# Patient Record
Sex: Female | Born: 1950
Health system: Southern US, Community
[De-identification: ages and names within clinical notes are randomized; demographics above are authoritative.]

## PROBLEM LIST (undated history)

## (undated) DIAGNOSIS — F329 Major depressive disorder, single episode, unspecified: Secondary | ICD-10-CM

## (undated) DIAGNOSIS — F32A Depression, unspecified: Secondary | ICD-10-CM

## (undated) DIAGNOSIS — E119 Type 2 diabetes mellitus without complications: Secondary | ICD-10-CM

## (undated) DIAGNOSIS — G4730 Sleep apnea, unspecified: Secondary | ICD-10-CM

## (undated) DIAGNOSIS — T7840XA Allergy, unspecified, initial encounter: Secondary | ICD-10-CM

## (undated) DIAGNOSIS — K219 Gastro-esophageal reflux disease without esophagitis: Secondary | ICD-10-CM

## (undated) DIAGNOSIS — I10 Essential (primary) hypertension: Secondary | ICD-10-CM

## (undated) DIAGNOSIS — Z5189 Encounter for other specified aftercare: Secondary | ICD-10-CM

## (undated) DIAGNOSIS — F419 Anxiety disorder, unspecified: Secondary | ICD-10-CM

## (undated) HISTORY — DX: Encounter for other specified aftercare: Z51.89

## (undated) HISTORY — DX: Anxiety disorder, unspecified: F41.9

## (undated) HISTORY — DX: Depression, unspecified: F32.A

## (undated) HISTORY — DX: Essential (primary) hypertension: I10

## (undated) HISTORY — PX: COLONOSCOPY: SHX174

## (undated) HISTORY — PX: ABDOMINAL HYSTERECTOMY: SHX81

## (undated) HISTORY — DX: Major depressive disorder, single episode, unspecified: F32.9

## (undated) HISTORY — DX: Allergy, unspecified, initial encounter: T78.40XA

---

## 1997-08-01 ENCOUNTER — Encounter: Admission: RE | Admit: 1997-08-01 | Discharge: 1997-08-01 | Payer: Self-pay | Admitting: Hematology and Oncology

## 1997-12-28 ENCOUNTER — Encounter: Admission: RE | Admit: 1997-12-28 | Discharge: 1997-12-28 | Payer: Self-pay | Admitting: Internal Medicine

## 1998-05-26 ENCOUNTER — Encounter: Admission: RE | Admit: 1998-05-26 | Discharge: 1998-05-26 | Payer: Self-pay | Admitting: Internal Medicine

## 1998-11-27 ENCOUNTER — Encounter: Admission: RE | Admit: 1998-11-27 | Discharge: 1998-11-27 | Payer: Self-pay | Admitting: Hematology and Oncology

## 1999-04-17 ENCOUNTER — Encounter: Admission: RE | Admit: 1999-04-17 | Discharge: 1999-04-17 | Payer: Self-pay | Admitting: Hematology and Oncology

## 1999-06-19 ENCOUNTER — Encounter: Admission: RE | Admit: 1999-06-19 | Discharge: 1999-06-19 | Payer: Self-pay | Admitting: Internal Medicine

## 1999-09-27 ENCOUNTER — Encounter: Admission: RE | Admit: 1999-09-27 | Discharge: 1999-09-27 | Payer: Self-pay | Admitting: Internal Medicine

## 1999-10-05 ENCOUNTER — Ambulatory Visit (HOSPITAL_COMMUNITY): Admission: RE | Admit: 1999-10-05 | Discharge: 1999-10-05 | Payer: Self-pay | Admitting: Internal Medicine

## 1999-10-05 ENCOUNTER — Encounter: Payer: Self-pay | Admitting: Obstetrics

## 1999-10-12 ENCOUNTER — Encounter: Admission: RE | Admit: 1999-10-12 | Discharge: 1999-10-12 | Payer: Self-pay | Admitting: Internal Medicine

## 1999-10-12 ENCOUNTER — Encounter: Payer: Self-pay | Admitting: Internal Medicine

## 2000-03-28 ENCOUNTER — Encounter: Admission: RE | Admit: 2000-03-28 | Discharge: 2000-03-28 | Payer: Self-pay | Admitting: Internal Medicine

## 2000-07-16 ENCOUNTER — Encounter: Admission: RE | Admit: 2000-07-16 | Discharge: 2000-07-16 | Payer: Self-pay | Admitting: Internal Medicine

## 2001-07-17 ENCOUNTER — Other Ambulatory Visit: Admission: RE | Admit: 2001-07-17 | Discharge: 2001-07-17 | Payer: Self-pay | Admitting: Internal Medicine

## 2012-10-17 ENCOUNTER — Ambulatory Visit: Payer: Self-pay | Admitting: Family Medicine

## 2012-10-17 VITALS — BP 132/80 | HR 92 | Temp 99.0°F | Resp 16 | Ht 60.5 in | Wt 157.4 lb

## 2012-10-17 DIAGNOSIS — M2420 Disorder of ligament, unspecified site: Secondary | ICD-10-CM

## 2012-10-17 DIAGNOSIS — M76899 Other specified enthesopathies of unspecified lower limb, excluding foot: Secondary | ICD-10-CM

## 2012-10-17 DIAGNOSIS — M629 Disorder of muscle, unspecified: Secondary | ICD-10-CM

## 2012-10-17 DIAGNOSIS — M7632 Iliotibial band syndrome, left leg: Secondary | ICD-10-CM

## 2012-10-17 DIAGNOSIS — M7061 Trochanteric bursitis, right hip: Secondary | ICD-10-CM

## 2012-10-17 MED ORDER — PREDNISONE 20 MG PO TABS: ORAL_TABLET | ORAL | Status: DC

## 2012-10-17 NOTE — Patient Instructions (Addendum)
Hip Bursitis Bursitis is a swelling and soreness (inflammation) of a fluid-filled sac (bursa). This sac overlies and protects the joints.  CAUSES   Injury.  Overuse of the muscles surrounding the joint.  Arthritis.  Gout.  Infection.  Cold weather.  Inadequate warm-up and conditioning prior to activities. The cause may not be known.  SYMPTOMS   Mild to severe irritation.  Tenderness and swelling over the outside of the hip.  Pain with motion of the hip.  If the bursa becomes infected, a fever may be present. Redness, tenderness, and warmth will develop over the hip. Symptoms usually lessen in 3 to 4 weeks with treatment, but can come back. TREATMENT If conservative treatment does not work, your caregiver may advise draining the bursa and injecting cortisone into the area. This may speed up the healing process. This may also be used as an initial treatment of choice. HOME CARE INSTRUCTIONS   Apply ice to the affected area for 15-20 minutes every 3 to 4 hours while awake for the first 2 days. Put the ice in a plastic bag and place a towel between the bag of ice and your skin.  Rest the painful joint as much as possible, but continue to put the joint through a normal range of motion at least 4 times per day. When the pain lessens, begin normal, slow movements and usual activities to help prevent stiffness of the hip.  Only take over-the-counter or prescription medicines for pain, discomfort, or fever as directed by your caregiver.  Use crutches to limit weight bearing on the hip joint, if advised.  Elevate your painful hip to reduce swelling. Use pillows for propping and cushioning your legs and hips.  Gentle massage may provide comfort and decrease swelling. SEEK IMMEDIATE MEDICAL CARE IF:   Your pain increases even during treatment, or you are not improving.  You have a fever.  You have heat and inflammation over the involved bursa.  You have any other questions or  concerns. MAKE SURE YOU:   Understand these instructions.  Will watch your condition.  Will get help right away if you are not doing well or get worse. Document Released: 06/29/2001 Document Revised: 04/01/2011 Document Reviewed: 01/27/2008 [employer] Patient Information 2014 [place], [place]. Iliotibial Band Syndrome Iliotibial band syndrome is pain in the outer, upper thigh. The pain is due to an inflammation (soreness) of the iliotibial band. This is a band of thick fibrous tissue that runs down the outside of the thigh. The iliotibial band begins at the hip. It extends to the outer side of the shin bone (tibia) just below the knee joint. The band works with the thigh muscles. Together they provide stability to the outside of the knee joint. Iliotibial band syndrome (ITBS) occurs when there is inflammation to this band of tissue. The irritation usually occurs over the outside of the knee joint, at the lateral epicondyle (the end of the femur bone.) The iliotibial band crosses bone and muscle at this point. Between these structures is a bursa (a cushioning sac). The bursa should make possible a smooth gliding motion. However, when inflamed, the iliotibial band does not glide easily. When inflamed, there is pain with motion of the knee. Usually the pain worsens with continued movement and the pain goes away with rest. This problem usually arises when there is a sudden increase in sports activities involving the lower extremities (your legs). Running, and playing soccer or basketball are examples of activities causing this. Others who are prone  to ITBS include individuals with mechanical problems such as leg length differences, abnormality of walking, bowed legs etc. This diagnosis (learning what is wrong) is made by examination. X-rays are usually normal if only soft tissue inflammation is present. Treatment of ITBS begins with proper footwear, icing the area of pain, stretching, and resting for a  period of time. Incorporating low-impact cross-training activities may also help. Your caregiver may prescribe anti-inflammatory medications as well. HOME CARE INSTRUCTIONS   Apply ice to the sore area for 15-20 minutes, 3-4 times per day. Put the ice in a plastic bag and place a towel between the bag of ice and your skin.  Limit excessive training or eliminate training until pain goes away.  While pain is present, you may use gentle range of motion. Do not resume regular use until instructed by your caregiver. Begin use gradually. Do not increase use to the point of pain. If pain does develop, decrease use and continue the above measures. Gradually increase activities that do not cause discomfort. Do this until you finally achieve normal use.  Perform low impact activities while pain is present. Wear proper footwear.  Only take over-the-counter or prescription medicines for pain, discomfort, or fever as directed by your caregiver. SEEK MEDICAL CARE IF:   Your pain increases or pain is not controlled with medications.  You develop new, unexplained symptoms (problems), or an increase of the symptoms that brought you to your caregiver.  Your pain and symptoms are not improving or are getting worse. Document Released: 06/29/2001 Document Revised: 04/01/2011 Document Reviewed: 01/07/2005 [facility] Patient Information 2014 [place], [place].

## 2012-10-17 NOTE — Progress Notes (Signed)
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  The pain is a burning quality, worse when weight bearing.  No known trauma that might have initiated the pain.  Associated with low back pain.  Objective:  NAD Tender over the right trochanger and lateral thigh with good thigh ROM No SLR  Assessment:  Iliotibial band syndrome with some trochanteric bursitis.  Plan:  Prednisone. Iliotibial band syndrome of left side - Plan: predniSONE (DELTASONE) 20 MG tablet  Trochanteric bursitis of right hip - Plan: predniSONE (DELTASONE) 20 MG tablet  Signed, Elvina Sidle, MD

## 2012-11-20 ENCOUNTER — Telehealth: Payer: Self-pay

## 2012-11-20 NOTE — Telephone Encounter (Signed)
Pt would like to know if the compression hose would help her leg pain, she c/o of burning sensation in the thigh and hip. Best# [phone]

## 2012-11-23 NOTE — Telephone Encounter (Signed)
Compression hose may offer some relief of symptoms.  These can be purchased at a medical supply company or online.  Would recommend light to medium pressure only

## 2012-11-23 NOTE — Telephone Encounter (Signed)
Called her to advise. Left message for her to advise.

## 2012-11-23 NOTE — Telephone Encounter (Signed)
Called her to advise.  

## 2013-05-17 ENCOUNTER — Ambulatory Visit: Payer: Self-pay | Admitting: Family Medicine

## 2013-05-17 VITALS — BP 122/84 | HR 96 | Temp 99.0°F | Resp 16 | Ht 59.0 in | Wt 164.0 lb

## 2013-05-17 DIAGNOSIS — D179 Benign lipomatous neoplasm, unspecified: Secondary | ICD-10-CM

## 2013-05-17 DIAGNOSIS — M76899 Other specified enthesopathies of unspecified lower limb, excluding foot: Secondary | ICD-10-CM

## 2013-05-17 DIAGNOSIS — L219 Seborrheic dermatitis, unspecified: Secondary | ICD-10-CM

## 2013-05-17 DIAGNOSIS — M542 Cervicalgia: Secondary | ICD-10-CM

## 2013-05-17 DIAGNOSIS — M7060 Trochanteric bursitis, unspecified hip: Secondary | ICD-10-CM

## 2013-05-17 MED ORDER — DICLOFENAC SODIUM 75 MG PO TBEC: 75.0000 mg | DELAYED_RELEASE_TABLET | Freq: Two times a day (BID) | ORAL | Status: DC

## 2013-05-17 MED ORDER — CYCLOBENZAPRINE HCL 10 MG PO TABS: 10.0000 mg | ORAL_TABLET | Freq: Three times a day (TID) | ORAL | Status: DC | PRN

## 2013-05-17 NOTE — Patient Instructions (Signed)
Take the diclofenac one twice daily with breakfast and supper and a glass of water to treat the bursitis of the right hip. This will also help your neck pain.  Take the muscle relaxant one at bedtime.  Use some 1% hydrocortisone cream once or twice daily in the ear lobe to help control the flaking and itching. It will probably come back from time to time, but she usually only need to be treated for a week or 2 at a time, then save the medicine for re-use at a later time.  Return if not improving  That place on your neck, the little lump, should either gradually go away if it is a lymph node, or stay about the same if it is a fatty tumor (lipoma). If it is growing you should come in for a recheck of it.

## 2013-05-17 NOTE — Progress Notes (Signed)
Subjective: Patient is here complaining of her neck hurting her. She thinks she slept wrong or sleeps wrong and he just hurting her. She doesn't sleep very soundly anyhow. She has been hurting in the neck and rub in her neck she is noted a little lump on the back of the neck. Then she noticed a couple of other places up in the scalp she was concerned about. She continues to have problems with her right hip. Dr. Joseph Art and told her that she had bursitis of the hip. He had apparently operative her an injection and she declined. She also complains of itching and flaking of the skin inside the ear lobes, not down in the year of the canal.  Objective: Mild seborrheic appearance in the ears. Ear canals and TMs look normal. Her skull has normal  irregularities to its contour which seemed to be fine. She is a little tender in the paraspinous muscles in the neck. On the right side there is a deep small nodule about 1-1.5 cm in diameter. Does not seem to be a sebaceous cyst and no poor can be seen in the skin. However she does have tenderness of the right greater trochanteric bursal region.  Assessment: Cervical pain Probable lipoma or lymph node in neck Normal bumpy skull Trochanteric bursitis Seborrhea in ear lobes  Plan: Diclofenac Flexeril at bedtime If hip continues to bother her I be happy to inject some cortisone in the greater trochanter bursa but she declined that at this time.

## 2013-09-19 ENCOUNTER — Ambulatory Visit (INDEPENDENT_AMBULATORY_CARE_PROVIDER_SITE_OTHER): Payer: Self-pay | Admitting: Family Medicine

## 2013-09-19 VITALS — BP 134/88 | HR 94 | Temp 97.7°F | Resp 18 | Ht 60.0 in | Wt 161.6 lb

## 2013-09-19 DIAGNOSIS — M542 Cervicalgia: Secondary | ICD-10-CM

## 2013-09-19 MED ORDER — CYCLOBENZAPRINE HCL 10 MG PO TABS: 10.0000 mg | ORAL_TABLET | Freq: Every day | ORAL | Status: DC

## 2013-09-19 MED ORDER — METHYLPREDNISOLONE 4 MG PO TABS: 4.0000 mg | ORAL_TABLET | Freq: Two times a day (BID) | ORAL | Status: DC

## 2013-09-19 NOTE — Progress Notes (Signed)
Subjective:  This chart was scribed for Robyn Haber, MD by Forrestine Him, [facility] Scribe. This patient was seen in room 8 and the patient's care was started 9:29 AM.    Patient ID: Janice Hamilton, female    DOB: August 30, 1950, 63 y.o.   MRN: [id number]  Chief Complaint  Patient presents with   Neck Pain    sinced it lately --nodules-- mostly in the morning--feels bigger then get smaller later in the day   Bursitis   Medication Refill    HPI HPI Comments: Janice Hamilton is a 63 y.o. female who presents to [facility] complaining of constant, moderate, non-radiating R sided neck pain that has been ongoing for several months. Pt states she noted a "knot" to the neck 4 months ago. Pain is exacerbated with ROM of neck. No alleviating factors at this time. Pt has tried hot showers and warm compresses to the neck with mild temporary improvement. She states she is unable to get comfortable at night time which is keeping her from a good nights sleep. States "my head feels so heavy at times". At this time she denies any fever, chills, numbness, or loss of sensation. No known allergies to medications. No other concerns this visit.  Pt is a Secretary/administrator.  There are no active problems to display for this patient.  Past Medical History  Diagnosis Date   Allergy    Depression    Anxiety    Blood transfusion without reported diagnosis    Hypertension    Past Surgical History  Procedure Laterality Date   Abdominal hysterectomy     No Known Allergies Prior to Admission medications   Medication Sig Start Date End Date Taking? Authorizing Provider  cyclobenzaprine (FLEXERIL) 10 MG tablet Take 1 tablet (10 mg total) by mouth 3 (three) times daily as needed for muscle spasms. 05/17/13   Posey Boyer, MD  diclofenac (VOLTAREN) 75 MG EC tablet Take 1 tablet (75 mg total) by mouth 2 (two) times daily. 05/17/13   Posey Boyer, MD    Review of Systems    Constitutional: Negative for fever and chills.  Musculoskeletal: Positive for neck pain.  Neurological: Negative for weakness and numbness.    Triage Vitals: BP 134/88   Pulse 94   Temp(Src) 97.7 F (36.5 C) (Oral)   Resp 18   Ht 5' (1.524 m)   Wt 161 lb 9.6 oz (73.301 kg)   BMI 31.56 kg/m2   SpO2 95%  Objective:  Physical Exam  Nursing note and vitals reviewed. Constitutional: She is oriented to person, place, and time. She appears well-developed and well-nourished.  HENT:  Head: Normocephalic.  Right Ear: Hearing, tympanic membrane, external ear and ear canal normal.  Left Ear: Hearing, tympanic membrane, external ear and ear canal normal.  Nose: Nose normal.  Mouth/Throat: Uvula is midline and oropharynx is clear and moist.  Eyes: EOM are normal.  Neck: Normal range of motion.  Cardiovascular: Normal rate, regular rhythm and normal heart sounds.   Pulmonary/Chest: Effort normal and breath sounds normal.  Abdominal: She exhibits no distension.  Musculoskeletal: Normal range of motion. She exhibits tenderness.  Tightness to R trapezius muscle Good ROM of neck  Lymphadenopathy:    She has no cervical adenopathy.  Neurological: She is alert and oriented to person, place, and time.  Psychiatric: She has a normal mood and affect.    Assessment & Plan:  I personally performed the services described  in this documentation, which was scribed in my presence. The recorded information has been reviewed and is accurate.  Cervical pain - Plan: cyclobenzaprine (FLEXERIL) 10 MG tablet, methylPREDNISolone (MEDROL) 4 MG tablet  Signed, Robyn Haber, MD

## 2013-09-19 NOTE — Patient Instructions (Signed)

## 2016-03-15 DIAGNOSIS — J40 Bronchitis, not specified as acute or chronic: Secondary | ICD-10-CM | POA: Diagnosis not present

## 2016-03-15 DIAGNOSIS — J329 Chronic sinusitis, unspecified: Secondary | ICD-10-CM | POA: Diagnosis not present

## 2016-04-20 ENCOUNTER — Ambulatory Visit (INDEPENDENT_AMBULATORY_CARE_PROVIDER_SITE_OTHER): Payer: Medicare Other | Admitting: Physician Assistant

## 2016-04-20 ENCOUNTER — Telehealth: Payer: Self-pay | Admitting: Physician Assistant

## 2016-04-20 ENCOUNTER — Ambulatory Visit (INDEPENDENT_AMBULATORY_CARE_PROVIDER_SITE_OTHER): Payer: Medicare Other

## 2016-04-20 VITALS — BP 136/85 | HR 88 | Temp 98.0°F | Resp 18 | Ht 59.0 in | Wt 158.4 lb

## 2016-04-20 DIAGNOSIS — M25561 Pain in right knee: Secondary | ICD-10-CM | POA: Diagnosis not present

## 2016-04-20 DIAGNOSIS — G8929 Other chronic pain: Secondary | ICD-10-CM

## 2016-04-20 DIAGNOSIS — Z131 Encounter for screening for diabetes mellitus: Secondary | ICD-10-CM

## 2016-04-20 DIAGNOSIS — R0600 Dyspnea, unspecified: Secondary | ICD-10-CM

## 2016-04-20 DIAGNOSIS — M25551 Pain in right hip: Secondary | ICD-10-CM

## 2016-04-20 LAB — POCT GLYCOSYLATED HEMOGLOBIN (HGB A1C): Hemoglobin A1C: 6.4

## 2016-04-20 MED ORDER — MELOXICAM 15 MG PO TABS: 7.5000 mg | ORAL_TABLET | Freq: Every day | ORAL | 0 refills | Status: DC

## 2016-04-20 MED ORDER — TRAMADOL HCL 50 MG PO TABS
25.0000 mg | ORAL_TABLET | Freq: Three times a day (TID) | ORAL | 0 refills | Status: DC | PRN
Start: 2016-04-20 — End: 2016-10-21

## 2016-04-20 NOTE — Patient Instructions (Signed)
     IF you received an x-ray today, you will receive an invoice from [facility]. Please contact [facility] at [phone] with questions or concerns regarding your invoice.   IF you received labwork today, you will receive an invoice from [facility]. Please contact [facility] at [phone] with questions or concerns regarding your invoice.   Our billing staff will not be able to assist you with questions regarding bills from these companies.  You will be contacted with the lab results as soon as they are available. The fastest way to get your results is to activate your My Chart account. Instructions are located on the last page of this paperwork. If you have not heard from us regarding the results in 2 weeks, please contact this office.     

## 2016-04-20 NOTE — Progress Notes (Signed)
04/20/2016 11:42 AM   DOB: 08/25/1950 / MRN: [id number]  SUBJECTIVE:  Janice Hamilton is a 67 y.o. female presenting for chronic leg pain that starts form the left hip and goes to the knee. Has a history of hip bursitis and IT band syndrome. The pain is described as a burn and worse with movements, particularly with stairs.  She has tried Advil 800 mg bid plus 2 tabs at night.   Complains of cramping of the left and right calf.  Denies swelling.  This occurs mostly in the evening and early afternoon.   Her husband is with her today and tells me she has been snoring for years.  Tells me she will also stop breathing and wake up gasping for breath.  Has excessive fatigue daily and chronic.   She has No Known Allergies.   She  has a past medical history of Allergy; Anxiety; Blood transfusion without reported diagnosis; Depression; and Hypertension.    She  reports that she has been smoking.  She has never used smokeless tobacco. She reports that she does not drink alcohol or use drugs. She  has no sexual activity history on file. The patient  has a past surgical history that includes Abdominal hysterectomy.  Her family history includes Diabetes in her mother; Hypothyroidism in her sister; Stroke in her father.  Review of Systems  Respiratory: Negative for cough.   Cardiovascular: Positive for PND. Negative for chest pain and orthopnea.  Musculoskeletal: Positive for joint pain and myalgias.  Neurological: Negative for dizziness.    The problem list and medications were reviewed and updated by myself where necessary and exist elsewhere in the encounter.   OBJECTIVE:  BP 136/85   Pulse 88   Temp 98 F (36.7 C) (Oral)   Resp 18   Ht 4\' 11"  (1.499 m)   Wt 158 lb 6 oz (71.8 kg)   SpO2 95%   BMI 31.99 kg/m   Physical Exam  Constitutional: She is oriented to person, place, and time. She appears well-developed and well-nourished. No distress.  Cardiovascular: Normal rate and regular  rhythm.   Pulmonary/Chest: Effort normal and breath sounds normal.  Musculoskeletal: Normal range of motion.  Neurological: She is alert and oriented to person, place, and time. No cranial nerve deficit.  Skin: Skin is warm and dry. She is not diaphoretic.  Psychiatric: She has a normal mood and affect.    Results for orders placed or performed in visit on 04/20/16 (from the past 72 hour(s))  POCT glycosylated hemoglobin (Hb A1C)     Status: None   Collection Time: 04/20/16 11:19 AM  Result Value Ref Range   Hemoglobin A1C 6.4     Dg Knee Complete 4 Views Right  Result Date: 04/20/2016 CLINICAL DATA:  Rt hip and knee pain possible bursitis No known injury EXAM: RIGHT KNEE - COMPLETE 4+ VIEW COMPARISON:  None. FINDINGS: No fracture or bone lesion. The knee joint is normally spaced and aligned. No arthropathic change. No convincing joint effusion.  Soft tissues are unremarkable. IMPRESSION: Negative. Electronically Signed   By: Lajean Manes M.D.   On: 04/20/2016 11:32   Dg Hip Unilat W Or W/o Pelvis 2-3 Views Right  Result Date: 04/20/2016 CLINICAL DATA:  Rt hip and knee pain possible bursitis. No known injury. EXAM: DG HIP (WITH OR WITHOUT PELVIS) 2-3V RIGHT COMPARISON:  None. FINDINGS: There is no evidence of hip fracture or dislocation. There is no evidence of arthropathy or other focal bone abnormality.  IMPRESSION: No acute osseous injury of the right hip. Electronically Signed   By: Kathreen Devoid   On: 04/20/2016 11:31    ASSESSMENT AND PLAN:  Janice Hamilton was seen today for leg pain.  Diagnoses and all orders for this visit:  Pain of right hip joint: Likely bursitis with IT band syndrome.  She dose not want an injection yet.  Will try an NSAID and tramadol for bad days.  Referral to PT.   -     DG HIP UNILAT W OR W/O PELVIS 2-3 VIEWS RIGHT; Future  Chronic pain of right knee -     DG Knee Complete 4 Views Right; Future  Screening for diabetes mellitus: Recheck in three months.   Advised she cut out sweets.   -     POCT glycosylated hemoglobin (Hb A1C) -     Basic metabolic panel  Paroxysmal nocturnal dyspnea: She will see Dr. Keturah Barre in sleep medication. Appreciate her wonderful care.  -     Ambulatory referral to Neurology    The patient is advised to call or return to clinic if she does not see an improvement in symptoms, or to seek the care of the closest emergency department if she worsens with the above plan.   Philis Fendt, MHS, PA-C [facility] 04/20/2016 11:42 AM

## 2016-04-20 NOTE — Telephone Encounter (Signed)
Pt has always gone on nickname [facility] but now with medicare legal name has been updated. Please re-write rx for tramadol, have ready for pick up 04/22/16, pt is aware they must bring old copy of rx to exchange for new version.

## 2016-04-21 LAB — BASIC METABOLIC PANEL
BUN/Creatinine Ratio: 29 — ABNORMAL HIGH (ref 12–28)
BUN: 26 mg/dL (ref 8–27)
CO2: 21 mmol/L (ref 18–29)
Calcium: 9.9 mg/dL (ref 8.7–10.3)
Chloride: 100 mmol/L (ref 96–106)
Creatinine, Ser: 0.91 mg/dL (ref 0.57–1.00)
GFR calc Af Amer: 77 mL/min/{1.73_m2} (ref >59)
GFR calc non Af Amer: 66 mL/min/{1.73_m2} (ref >59)
Glucose: 104 mg/dL — ABNORMAL HIGH (ref 65–99)
Potassium: 4.7 mmol/L (ref 3.5–5.2)
Sodium: 139 mmol/L (ref 134–144)

## 2016-04-22 NOTE — Telephone Encounter (Signed)
I will call in when old rx is turned .

## 2016-04-23 NOTE — Telephone Encounter (Signed)
Old turned in and destroyed, called rx with correct name to [facility]

## 2016-06-11 ENCOUNTER — Other Ambulatory Visit: Payer: Self-pay | Admitting: Physician Assistant

## 2016-06-13 ENCOUNTER — Ambulatory Visit (INDEPENDENT_AMBULATORY_CARE_PROVIDER_SITE_OTHER): Payer: Medicare Other | Admitting: Neurology

## 2016-06-13 ENCOUNTER — Encounter (INDEPENDENT_AMBULATORY_CARE_PROVIDER_SITE_OTHER): Payer: Self-pay

## 2016-06-13 ENCOUNTER — Encounter: Payer: Self-pay | Admitting: Neurology

## 2016-06-13 VITALS — BP 152/94 | HR 87 | Resp 20 | Ht 59.0 in | Wt 161.0 lb

## 2016-06-13 DIAGNOSIS — G4730 Sleep apnea, unspecified: Secondary | ICD-10-CM

## 2016-06-13 DIAGNOSIS — J3489 Other specified disorders of nose and nasal sinuses: Secondary | ICD-10-CM

## 2016-06-13 DIAGNOSIS — G4733 Obstructive sleep apnea (adult) (pediatric): Secondary | ICD-10-CM | POA: Diagnosis not present

## 2016-06-13 DIAGNOSIS — G4710 Hypersomnia, unspecified: Secondary | ICD-10-CM | POA: Diagnosis not present

## 2016-06-13 DIAGNOSIS — G4719 Other hypersomnia: Secondary | ICD-10-CM | POA: Diagnosis not present

## 2016-06-13 DIAGNOSIS — R0683 Snoring: Secondary | ICD-10-CM | POA: Diagnosis not present

## 2016-06-13 NOTE — Progress Notes (Signed)
SLEEP MEDICINE CLINIC   Provider:  Larey Seat, M D  Primary Care Physician:  Hillis Range   Referring Provider: Tereasa Coop, PA-C   Chief Complaint  Patient presents with  . New Patient (Initial Visit)    snores, never had a sleep study    HPI:  Janice Hamilton is a 66 y.o. female , seen here as in a referral from [place] for a sleep evaluation.   Chief complaint according to patient :" I snore so bad- so embarrassing "   Janice Hamilton is a 68 -year-old Caucasian right-handed female who has a history of chronic leg pain, hip bursitis, cramping, excessive daytime fatigue, respiratory allergies, depression and hypertension. The patient had a surgical abdominal hysterectomy. Over the last 3 years or so her snoring has evolved into a very loud, thunderous nocturnal breathing and her partner for 27 years is no longer sleeping in the same room. Her partner has multiple times told her that she stops to breath-  very upsetting to him. Only recently was she advised that her grandson ( 7)  to stay awake for just 5 minutes and not snore so he can sleep.  Sleep habits are as follows: Janice Hamilton reports that she falls asleep every evening while watching TV. She falls asleep whenever she is not physically active on mentally stimulated. She often feels not tired but the sleep overwhelms her. She spends the morning hours at her daughter's house between 9 and 12 and takes care of her dog. She falls asleep or struggles with sleepiness driving to her daughter's home and back, mostly on the way back. She takes a nap every day afternoon after lunch, duration up to 2 hours, and  by 9 PM  she is asleep no matter what she is watching on TV. Janice Hamilton, her partner, will wake her up between 38 and 11 usually when her snoring in the living room has started to bother him, and she will go to her bedroom. She has no trouble continuing to sleep there, she wakes up about every 2 hours or so sometimes has to  go to the bathroom, sometimes she just wakes up perhaps because of snoring or apnea. She rises in the morning at 6 AM and feels exhausted.  Sleep medical history and family sleep history: no sleep walking or bedwetting history, OSA suspected to be present for the last 27 years.   Her parents passed away when she was young and she does not recall any sleep problems affecting them, she has a sister who is healthy.  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  lives with her partner of 27 years , Janice Hamilton . Janice Hamilton is 34 years younger . Adult child daughter , now 6. She  is an active smoker and does not drink alcohol, she drinks cog fee in AM, iced tea throughout the day , 2-3 , no sodas.   Review of Systems: Out of a complete 14 system review, the patient complains of only the following symptoms, and all other reviewed systems are negative. Epworth score 17 , Fatigue severity score 49  , depression score n/a    Social History   Social History  . Marital status: Legally Separated    Spouse name: N/A  . Number of children: N/A  . Years of education: N/A   Occupational History  . Not on file.   Social History Main Topics  . Smoking status: Current Every Day  Smoker  . Smokeless tobacco: Never Used  . Alcohol use No  . Drug use: No  . Sexual activity: Not on file   Other Topics Concern  . Not on file   Social History Narrative  . No narrative on file    Family History  Problem Relation Age of Onset  . Diabetes Mother   . Stroke Father   . Hypothyroidism Sister     Past Medical History:  Diagnosis Date  . Allergy   . Anxiety   . Blood transfusion without reported diagnosis   . Depression   . Hypertension     Past Surgical History:  Procedure Laterality Date  . ABDOMINAL HYSTERECTOMY      Current Outpatient Prescriptions  Medication Sig Dispense Refill  . meloxicam (MOBIC) 15 MG tablet Take 0.5-1 tablets (7.5-15 mg total) by mouth daily. Use  sparingly as NSAIDs are not without risk. 30 tablet 0  . traMADol (ULTRAM) 50 MG tablet Take 0.5-1 tablets (25-50 mg total) by mouth every 8 (eight) hours as needed for severe pain. 30 tablet 0   No current facility-administered medications for this visit.     Allergies as of 06/13/2016  . (No Known Allergies)    Vitals: BP (!) 152/94   Pulse 87   Resp 20   Ht 4\' 11"  (1.499 m)   Wt 161 lb (73 kg)   BMI 32.52 kg/m  Last Weight:  Wt Readings from Last 1 Encounters:  06/13/16 161 lb (73 kg)   TFT:DDUK mass index is 32.52 kg/m.     Last Height:   Ht Readings from Last 1 Encounters:  06/13/16 4\' 11"  (1.499 m)    Physical exam:  General: The patient is awake, alert and appears not in acute distress. The patient is well groomed. Head: Normocephalic, atraumatic. Neck is supple. Mallampati 5 - extremely small upper airway.  neck circumference: 17. Nasal airflow congestion, TMJ is evident . Retrognathia is seen.  Cardiovascular:  Regular rate and rhythm, without  murmurs or carotid bruit, and without distended neck veins. Respiratory: Lungs are clear to auscultation. Skin:  Without evidence of edema, or rash Trunk: BMI is 33. The patient's posture is erect   Neurologic exam : The patient is awake and alert, oriented to place and time.  Attention span & concentration ability appears normal.  Speech is fluent,  without dysarthria, dysphonia or aphasia.  Mood and affect are appropriate.  Cranial nerves: Pupils are equal and briskly reactive to light. Funduscopic exam without  evidence of pallor or edema.  Extraocular movements  in vertical and horizontal planes intact and without nystagmus. Visual fields by finger perimetry are intact. Hearing to finger rub intact. Facial sensation intact to fine touch.Facial motor strength is symmetric and tongue and uvula move midline. Shoulder shrug was symmetrical.   Motor exam: Normal tone, muscle bulk and symmetric strength in all  extremities.  Sensory:  Fine touch, pinprick and vibration were tested in all extremities. Proprioception tested in the upper extremities was normal.  Coordination: Rapid alternating movements in the fingers/hands was normal. Finger-to-nose maneuver  normal without evidence of ataxia, dysmetria or tremor.  Gait and station: Patient walks without assistive device and is able unassisted to climb up to the exam table. Strength within normal limits.  Stance is stable and normal. Deep tendon reflexes: in the  upper and lower extremities are symmetric and intact.    Assessment:  After physical and neurologic examination, review of laboratory studies,  Personal review  of imaging studies, reports of other /same  Imaging studies, results of polysomnography and / or neurophysiology testing and pre-existing records as far as provided in visit., my assessment is; Mrs. Revak has very high risk factors for obstructive sleep apnea and she herself is pretty sure that she have it. She is a loud snorer, the amplitude of snoring has increased continuously over the last years and her partner has witnessed her to stop breathing. She has become extremely fatigued in daytime with the fatigue severity score 49 and very sleepy with an Epworth score of 17. Based on the significant impairment in her daily functioning due to not getting good sleep I would like her to have a split-night polysomnography as soon as possible. I would like to marks the study is urgent. She wakes up with a dry mouth but not with headaches, and I do not think we need capnography to be included in the setting.     1)  Hypersomnia , extreme Epworth 17 , and fatigue-   2)  Witnessed snoring and apnea.   3) obesity - has no exercise or dietary rules.   4) high grade Mallampati      The patient was advised of the nature of the diagnosed disorder , the treatment options and the  risks for general health and wellness arising from not treating the  condition.   I spent more than  minutes of face to face time with the patient.  Greater than 50% of time was spent in counseling and coordination of care. We have discussed the diagnosis and differential and I answered the patient's questions.    Plan:  Treatment plan and additional workup :  SPLIT night ASAP.    Larey Seat, MD 07/13/6331, 5:45 AM  Certified in Neurology by ABPN Certified in [place] by [facility] [address] [place], [place] [address]

## 2016-06-19 ENCOUNTER — Telehealth: Payer: Self-pay

## 2016-06-19 NOTE — Telephone Encounter (Signed)
Called pt to schedule Medicare Annual Wellness Visit. -nr  

## 2016-07-11 ENCOUNTER — Ambulatory Visit (INDEPENDENT_AMBULATORY_CARE_PROVIDER_SITE_OTHER): Payer: Medicare Other | Admitting: Neurology

## 2016-07-11 DIAGNOSIS — R0683 Snoring: Secondary | ICD-10-CM

## 2016-07-11 DIAGNOSIS — G4710 Hypersomnia, unspecified: Secondary | ICD-10-CM

## 2016-07-11 DIAGNOSIS — G4733 Obstructive sleep apnea (adult) (pediatric): Secondary | ICD-10-CM | POA: Diagnosis not present

## 2016-07-11 DIAGNOSIS — G4719 Other hypersomnia: Secondary | ICD-10-CM

## 2016-07-11 DIAGNOSIS — G4730 Sleep apnea, unspecified: Secondary | ICD-10-CM

## 2016-07-11 DIAGNOSIS — J3489 Other specified disorders of nose and nasal sinuses: Secondary | ICD-10-CM

## 2016-07-27 NOTE — Addendum Note (Signed)
Addended by: Larey Seat on: 07/27/2016 03:55 PM   Modules accepted: Orders

## 2016-07-27 NOTE — Procedures (Signed)
PATIENT'S NAME:  Janice Hamilton, Janice Hamilton DOB:      May 15, 1950      MR#:    [id number]     DATE OF RECORDING: 07/11/2016 REFERRING M.D.:  Philis Fendt, PA Study Performed:  Split-Night Titration Study HISTORY: Janice Hamilton is a loud snorer and has developed Excessive daytime sleepiness, is suspected to have OSA.  Allergies, Anxiety, Arthritis, Depression, Hypertension, obesity. The patient endorsed the Epworth Sleepiness Scale at 17/24 points   The patient's weight 161 pounds with a height of 59 (inches), resulting in a BMI of 32.4 kg/m2.The patient's neck circumference measured 17 inches.  CURRENT MEDICATIONS: Mobic, Ultram PROCEDURE:  This is a multichannel digital polysomnogram utilizing the SomnoStar 11.2 system.  Electrodes and sensors were applied and monitored per AASM Specifications.   EEG, EOG, Chin and Limb EMG, were sampled at 200 Hz.  ECG, Snore and Nasal Pressure, Thermal Airflow, Respiratory Effort, CPAP Flow and Pressure, Oximetry was sampled at 50 Hz. Digital video and audio were recorded.      BASELINE STUDY WITHOUT CPAP RESULTS:  Lights Out was at 21:09 and Lights On at 05:00.  Total recording time (TRT) was 126, with a total sleep time (TST) of 61 minutes.  The patient's sleep latency was 69 minutes.  REM latency was 0 minutes.  The sleep efficiency was poor at only 48.4 %.    SLEEP ARCHITECTURE: WASO (Wake after sleep onset) was 26 minutes, Stage N1 was 12.5 minutes, Stage N2 was 17 minutes, Stage N3 was 31.5 minutes and Stage R (REM sleep) was 0 minutes.  The percentages were Stage N1 20.5%, Stage N2 27.9%, Stage N3 51.6% and Stage R (REM sleep) 0%.   RESPIRATORY ANALYSIS:  There were a total of 61 respiratory events:  0 apneas and 61 hypopneas with additionally 32 respiratory event related arousals (RERAs). Thunderous Snoring was noted.    The total APNEA/HYPOPNEA INDEX (AHI) was 60.0 /hour and the total RESPIRATORY DISTURBANCE INDEX was 91.5 /hour.  0 events occurred in REM sleep and  122 events in NREM. The REM AHI was 0, /hour versus a non-REM AHI of 60.0 /hour. The patient spent 0 minutes sleep time in the supine position 316 minutes in non-supine. The supine AHI was 0.0 /hour versus a non-supine AHI of 60.0 /hour.  OXYGEN SATURATION & C02:  The wake baseline 02 saturation was 91%, with the lowest being 84%. Time spent below 89% saturation equaled 25 minutes.  PERIODIC LIMB MOVEMENTS:    The patient had a total of 0 Periodic Limb Movements.  The arousals were noted as: 2 were spontaneous, 0 were associated with PLMs, and 55 were associated with respiratory events.   Audio and video analysis did not show any abnormal or unusual movements, behaviors, phonations or vocalizations. The patient struggled to stay asleep.The patient took three bathroom breaks, herEKG was in keeping with normal sinus rhythm (NSR)   TITRATION STUDY WITH CPAP RESULTS:   CPAP was initiated at 5 cmH20 with heated humidity per AASM split night standards and pressure was advanced to 11/11 cmH20 because of hypopneas, apneas and desaturations.  At a PAP pressure of 11 cmH20, there was a reduction of the AHI to 0.0 /hour. She stayed asleep for 218 minutes. Total recording time (TRT) was 345.5 minutes, with a total sleep time (TST) of 254.5 minutes. The patient's sleep latency was 82 minutes. REM latency was 34 minutes.  The sleep efficiency was 73.7 %.    SLEEP ARCHITECTURE: Wake after sleep was 35.5 minutes,  Stage N1 18.5 minutes, Stage N2 45.5 minutes, Stage N3 70 minutes and Stage R (REM sleep) 120.5 minutes. The percentages were: Stage N1 7.3%, Stage N2 17.9%, Stage N3 27.5% and Stage R (REM sleep) 47.3%. The sleep architecture was notable for REM sleep rebound. The arousals were noted as: 17 were spontaneous, 1 were associated with PLMs, and 6 were associated with respiratory events.  RESPIRATORY ANALYSIS:  There were a total of 0 respiratory events. The patient also had 5 respiratory event related  arousals (RERAs).     The total APNEA/HYPOPNEA INDEX (AHI) was 0.0 /hour and the total RESPIRATORY DISTURBANCE INDEX was 1.2 /hour.  The patient spent 0% of total sleep time in the supine position.  OXYGEN SATURATION & C02:  The wake baseline 02 saturation was 97%, with the lowest being 87%. Time spent below 89% saturation equaled 5 minutes.  PERIODIC LIMB MOVEMENTS:    The patient had a total of 49 Periodic Limb Movements. The Periodic Limb Movement (PLM) index was 11.6 /hour and the PLM Arousal index was 0.0 /hour. Post-study, the patient indicated that sleep was the best in a long time !  POLYSOMNOGRAPHY IMPRESSION :   1. Severe Obstructive Sleep Apnea(OSA),AHI of 60.0/hr.  2. Thunderous Snoring, high RDI. 3. Repetitive Intrusions of Sleep before CPAP was initiated. 4. Excellent response to CPAP at 11 cm water, heated humidity and FFM in small size F 20 by ResMed.    RECOMMENDATIONS:  5. Advise to start CPAP at 11 cm water, heated humidity and FFM in small size F 20 by ResMed.  1. EPR of 2 cmH2O and follow clinical symptomatology.   2. Advise to add heated humidity.  Adjust interface and heated humidity as needed.     3. Compliance to PAP therapy of 4 hours or more each night should be emphasized.  Compliance, AHI and air leak information to be downloaded for objective assessment at 30 days, 180 days and annually thereafter.   4. Further information regarding OSA may be obtained from [employer] (www.sleepfoundation.org) or American Sleep Apnea Association (www.sleepapnea.org). 5. A follow up appointment will be scheduled in the Sleep Clinic at [facility].      I certify that I have reviewed the entire raw data recording prior to the issuance of this report in accordance with the Standards of Accreditation of the American Academy of Sleep Medicine (AASM)      Larey Seat, M.D.  07-27-2016 Diplomat, American Board of Psychiatry and Neurology   Diplomat, [place] of Sleep Medicine Medical Director, [place] [facility] at [facility]

## 2016-07-29 ENCOUNTER — Telehealth: Payer: Self-pay | Admitting: Neurology

## 2016-07-29 NOTE — Telephone Encounter (Signed)
Called pt with sleep study results. No answer. LVM for pt to call me back

## 2016-07-29 NOTE — Telephone Encounter (Signed)
Spoke with pt son Mr Terrence Dupont, as he was listed on the [place]. Explained the sleep study results.  I advised him that Dr. Brett Fairy reviewed their sleep study results and found that pt did have sleep apnea. Dr. Brett Fairy recommends that pt start CPAP. I reviewed PAP compliance expectations with the pt. Pt is agreeable to starting a CPAP. I advised pt that an order will be sent to a DME, [facility], and [facility] will call the pt within about one week after they file with the pt's insurance. [facility] will show the pt how to use the machine, fit for masks, and troubleshoot the CPAP if needed. A follow up appt was made for insurance purposes with Dr. Brett Fairy on Monday,Oct 1st 2018 at 9:30am. Pt verbalized understanding to arrive 15 minutes early and bring their CPAP. A letter with all of this information in it will be mailed to the pt as a reminder. I verified with the pt that the address we have on file is correct. Pt verbalized understanding of results. Pt had no questions at this time but was encouraged to call back if questions arise.

## 2016-07-29 NOTE — Telephone Encounter (Signed)
-----   Message from Larey Seat, MD sent at 07/27/2016  3:55 PM EDT ----- PATIENT'S NAME:  Janice Hamilton DOB:      11/04/1950      MR#:    [id number]     DATE OF RECORDING: 07/11/2016 REFERRING M.D.:  Philis Fendt, PA Study Performed:  Split-Night Titration Study Result  1. Severe Obstructive Sleep Apnea(OSA), AHI of 60.0/hr.  2. Thunderous Snoring, high RDI. 3. Repetitive Intrusions of Sleep before CPAP was initiated. 4. Excellent response to CPAP at 11 cm water, heated humidity and FFM in small size F 20 by ResMed.    RECOMMENDATIONS:  1.  OSA and snoring resolved under CPAP- Advise to start CPAP at 11 cm water, heated humidity and FFM in small size F 20 by ResMed. EPR of 2 cmH2O and follow clinical symptomatology. Advise to add heated humidity.  Adjust interface and heated humidity as needed.     3. Compliance to PAP therapy of 4 hours or more each night should be emphasized.  Compliance, AHI and air leak information to be downloaded for objective assessment at 30 days, 180 days and annually thereafter.   4. Further information regarding OSA may be obtained from [employer] (www.sleepfoundation.org) or American Sleep Apnea Association (www.sleepapnea.org). 5. A follow up appointment will be scheduled in the Sleep Clinic at [facility]. I have ordered a CPAP machine through DME. The Referring physician will receive a copy of this report.        Larey Seat, M.D.  07-27-2016 Diplomat, American Board of Psychiatry and Neurology  Diplomat, [place] of Sleep Medicine Medical Director of [facility] at [facility]

## 2016-07-29 NOTE — Telephone Encounter (Signed)
-----   Message from Larey Seat, MD sent at 07/27/2016  3:55 PM EDT ----- PATIENT'S NAME:  Janice Hamilton DOB:      [birth year]      MR#:    [id number]     DATE OF RECORDING: 07/11/2016 REFERRING M.D.:  Philis Fendt, PA Study Performed:  Split-Night Titration Study Result  1. Severe Obstructive Sleep Apnea(OSA), AHI of 60.0/hr.  2. Thunderous Snoring, high RDI. 3. Repetitive Intrusions of Sleep before CPAP was initiated. 4. Excellent response to CPAP at 11 cm water, heated humidity and FFM in small size F 20 by ResMed.    RECOMMENDATIONS:  1.  OSA and snoring resolved under CPAP- Advise to start CPAP at 11 cm water, heated humidity and FFM in small size F 20 by ResMed. EPR of 2 cmH2O and follow clinical symptomatology. Advise to add heated humidity.  Adjust interface and heated humidity as needed.     3. Compliance to PAP therapy of 4 hours or more each night should be emphasized.  Compliance, AHI and air leak information to be downloaded for objective assessment at 30 days, 180 days and annually thereafter.   4. Further information regarding OSA may be obtained from [employer] (www.sleepfoundation.org) or American Sleep Apnea Association (www.sleepapnea.org). 5. A follow up appointment will be scheduled in the Sleep Clinic at [facility]. I have ordered a CPAP machine through DME. The Referring physician will receive a copy of this report.        Larey Seat, M.D.  07-27-2016 Diplomat, American Board of Psychiatry and Neurology  Diplomat, [place] of Sleep Medicine Medical Director of [facility] at [facility]

## 2016-07-29 NOTE — Telephone Encounter (Signed)
[place] returning a call for sleep results for the patient.

## 2016-07-30 NOTE — Telephone Encounter (Signed)
Per Gerline Legacy, RN - she has already spoken to Mr. Terrence Dupont (documented in separate phone note).

## 2016-10-21 ENCOUNTER — Ambulatory Visit (INDEPENDENT_AMBULATORY_CARE_PROVIDER_SITE_OTHER): Payer: Medicare Other | Admitting: Neurology

## 2016-10-21 ENCOUNTER — Encounter: Payer: Self-pay | Admitting: Neurology

## 2016-10-21 VITALS — BP 149/96 | HR 84 | Ht 59.0 in | Wt 163.0 lb

## 2016-10-21 DIAGNOSIS — J209 Acute bronchitis, unspecified: Secondary | ICD-10-CM | POA: Diagnosis not present

## 2016-10-21 DIAGNOSIS — G478 Other sleep disorders: Secondary | ICD-10-CM

## 2016-10-21 DIAGNOSIS — Z9989 Dependence on other enabling machines and devices: Secondary | ICD-10-CM | POA: Diagnosis not present

## 2016-10-21 DIAGNOSIS — G4733 Obstructive sleep apnea (adult) (pediatric): Secondary | ICD-10-CM

## 2016-10-21 NOTE — Progress Notes (Signed)
SLEEP MED[facility]INE CLINIC   Provider:  Larey Seat, M D  Primary Care Physician:  Hillis Range   Referring Provider: Tereasa Coop, PA-C   Chief Complaint  Patient presents with  . Follow-up    PT ALONE, RM 10.     HPI:  Janice Hamilton is a 66 y.o. female , seen here as in a referral from [place] Phil[facility] Fendt for a compliance visit.  I have the pleasure of seeing Janice Hamilton today on 10/21/2016 in a revisit, the patient was tested in a split-night polysomnography on 06/21/2018Carmen Charlii Hamilton, M.D. the sleep study confirmed a very severe form of sleep apnea, her AHI was 60 per hour-this equals 1 apnea or shallow breathing spells per minute of sleep, her RDI was 91.5-indicating loud snoring and upper airway resistance. Desaturation was borderline the patient spent only 25 minutes of sleep time at or below 88%, she did not have PLMS, her heart rate was in sinus rhythm, remarkable was that she took 3 bathroom breaks within 2 hours. Once CPAP was initiated, the patient's nocturia resolved, she was titrated to 11 cm water and was given a small size fullface mask by the technologist. EPR of 2 cm was added, her sleep became sustained and refreshing. In the morning after the titration the patient already indicated that her sleep was much better than usual. I have also the opportunity to review her CPAP compliance today she's 100% compliant with 7 hours and 6 minutes of nightly use, AHI is 0.4, using a full face mask. Fatigue severity scale was reduced to 25 and Epworth sleepiness score to 7 points from previously 17.  The patient reports that she sleeps so much better, feels so much more alert and asleep a sound and no longer interrupted by bathroom breaks as frequently.  She has just started an exercise regimen because now she feels the energy needed to take those steps. She hopes to lose some weight..         Chief complaint according to patient :" I snore so bad- so embarrassing "    05-2016 consult with CD Janice Hamilton is a 89 -year-old Caucasian right-handed female who has a history of chronic leg pain, hip bursitis, cramping, excessive daytime fatigue, respiratory allergies, depression and hypertension. The patient had a surgical abdominal hysterectomy. Over the last 3 years or so her snoring has evolved into a very loud, thunderous nocturnal breathing and her partner for 27 years is no longer sleeping in the same room. Her partner has multiple times told her that she stops to breath-  very upsetting to him. Only recently was she advised that her grandson ( 7)  to stay awake for just 5 minutes and not snore so he can sleep.  Sleep habits are as follows: Janice Hamilton reports that she falls asleep every evening while watching TV. She falls asleep whenever she is not physically active on mentally stimulated. She often feels not tired but the sleep overwhelms her. She spends the morning hours at her daughter's house between 9 and 12 and takes care of her dog. She falls asleep or struggles with sleepiness driving to her daughter's home and back, mostly on the way back. She takes a nap every day afternoon after lunch, duration up to 2 hours, and  by 9 PM  she is asleep no matter what she is watching on TV. Janice Hamilton, her partner, will wake her up between 71 and 11 usually when her snoring in the living  room has started to bother him, and she will go to her bedroom. She has no trouble continuing to sleep there, she wakes up about every 2 hours or so sometimes has to go to the bathroom, sometimes she just wakes up perhaps because of snoring or apnea. She rises in the morning at 6 AM and feels exhausted.  Sleep medical history and family sleep history: no sleep walking or bedwetting history, OSA suspected to be present for the last 27 years.   Her parents passed away when she was young and she does not recall any sleep problems affecting them, she has a sister who is healthy.  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  lives with her partner of 27 years , Janice Hamilton . Janice Hamilton is 27 years younger . Adult child daughter , now 43. She  is an active smoker and does not drink alcohol, she drinks cog fee in AM, iced tea throughout the day , 2-3 , no sodas.   Janice Hamilton has very high risk factors for obstructive sleep apnea and she herself is pretty sure that she have it. She is a loud snorer, the amplitude of snoring has increased continuously over the last years and her partner has witnessed her to stop breathing. She has become extremely fatigued in daytime with the fatigue severity score 49 and very sleepy with an Epworth score of 17. Based on the significant impairment in her daily functioning due to not getting good sleep I would like her to have a split-night polysomnography as soon as possible. I would like to marks the study is urgent. She wakes up with a dry mouth but not with headaches, and I do not think we need capnography to be included in the setting.   Review of Systems: Out of a complete 14 system review, the patient complains of only the following symptoms, and all other reviewed systems are negative. 05-2016 :Epworth score 17 , Fatigue severity score 49  , depression score n/a  10-21-2016, on PAP: Epworth 5, FSS 25, geriatric depression score 1/15    Social History   Social History  . Marital status: Legally Separated    Spouse name: N/A  . Number of children: N/A  . Years of education: N/A   Occupational History  . Not on file.   Social History Main Topics  . Smoking status: Current Every Day Smoker  . Smokeless tobacco: Never Used  . Alcohol use No  . Drug use: No  . Sexual activity: Not on file   Other Topics Concern  . Not on file   Social History Narrative  . No narrative on file    Family History  Problem Relation Age of Onset  . Diabetes Mother   . Stroke Father   . Hypothyroidism Sister     Past Medical History:  Diagnosis  Date  . Allergy   . Anxiety   . Blood transfusion without reported diagnosis   . Depression   . Hypertension     Past Surgical History:  Procedure Laterality Date  . ABDOMINAL HYSTERECTOMY      No current outpatient prescriptions on file.   No current facility-administered medications for this visit.     Allergies as of 10/21/2016  . (No Known Allergies)    Vitals: Ht 4\' 11"  (1.499 m)   Wt 163 lb (73.9 kg)   BMI 32.92 kg/m  Last Weight:  Wt Readings from Last 1 Encounters:  10/21/16 163 lb (  73.9 kg)   ONG:EXBM mass index is 32.92 kg/m.     Last Height:   Ht Readings from Last 1 Encounters:  10/21/16 4\' 11"  (1.499 m)    Physical exam:  General: The patient is awake, alert and appears not in acute distress. The patient is well groomed. Head: Normocephalic, atraumatic. Neck is supple. Mallampati 5 - extremely small upper airway.  neck circumference: 17.00 Nasal airflow congestion, TMJ click is not evident. Retrognathia is seen.  Tooth grinding marks- bruxism.  Cardiovascular:  Regular rate and rhythm, without  murmurs or carotid bruit, and without distended neck veins. Respiratory:  There is wheezing and rhonci- patient is very congested but feels still that CPAP is comfortable to use.  Skin:  Without evidence of edema, or rash Trunk: BMI is 33. The patient's posture is erect   Neurologic exam : The patient is awake and alert, oriented to place and time.   Attention span & concentration ability appears improved-  Speech is fluent,  With dysphonia.  Mood and affect are appropriate.  Cranial nerves:  nasal congestion, smell is affected, not taste -Pupils are equal and briskly reactive to light. Funduscopic exam without  evidence of pallor or edema.  Extraocular movements  in vertical and horizontal planes intact and without nystagmus. Visual fields by finger perimetry are intact. Hearing to finger rub intact. Facial sensation intact to fine touch.Facial motor  strength is symmetric and tongue and uvula move midline. Shoulder shrug was symmetrical.    Sensory:  unchanged Fine touch, pinprick and vibration were tested in all extremities. Proprioception tested in the upper extremities was normal. Motor exam: unchanged Normal tone, muscle bulk and symmetric strength in all extremities.  Patient walks without assistive device - Strength within normal limits.  Stance is stable and normal. Deep tendon reflexes: in the  upper and lower extremities are symmetric and intact.    Assessment:  After physical and neurologic examination, review of laboratory studies,  Personal review of polysomnography and / or neurophysiology testing and pre-existing records as far as provided in visit., my assessment is;     1)  Hypersomnia resolved - was clearly a result of severe OSA ( AHI was 60/hr) and UARS -RDI was over 90 . Had  Extreme daytime sleepiness at  Epworth 17 , and fatigue in MAY 2018 visit-  Now much lower ! benefits form PAP therapy. Epworth 7 and FSS 25.  She loves using CPAP ! No more nocturia, successfully controlling witnessed snoring and apnea. SPLIT study from 07-11-2016 reviewed.   2) obesity - has no exercise or dietary rules. Has just started going to the gym at a friend's house.   3) high grade Mallampati, not weight dependent       The patient was advised of the nature of the diagnosed disorder , the treatment options and the  risks for general health and wellness arising from not treating the condition.   I spent more than  minutes of face to face time with the patient.  Greater than 50% of time was spent in counseling and coordination of care.  We have discussed the diagnosis and differential and I answered the patient's questions.    Plan:  Treatment plan and additional workup : continue using your auto pap capable CPAP at 12 with 3 cm EPR ! You are doing well.    Try Mucinex without  D( not using decongestant)- and Zyrtec daily. This  helps with allergic bronchitis.   Drink fluids -  water! Step away from sweet teas.  RV in 12 month with me or NP.   Larey Seat, MD 59/0/9311, 2:16 AM  Certified in Neurology by ABPN Certified in [place] by [facility] [address] [place], [place] [address]

## 2016-11-30 ENCOUNTER — Ambulatory Visit (INDEPENDENT_AMBULATORY_CARE_PROVIDER_SITE_OTHER): Payer: Medicare Other | Admitting: Family Medicine

## 2016-11-30 ENCOUNTER — Encounter: Payer: Self-pay | Admitting: Family Medicine

## 2016-11-30 ENCOUNTER — Ambulatory Visit (INDEPENDENT_AMBULATORY_CARE_PROVIDER_SITE_OTHER): Payer: Medicare Other

## 2016-11-30 VITALS — BP 135/86 | HR 84 | Temp 98.4°F | Resp 18 | Ht 62.0 in | Wt 160.8 lb

## 2016-11-30 DIAGNOSIS — Z720 Tobacco use: Secondary | ICD-10-CM | POA: Diagnosis not present

## 2016-11-30 DIAGNOSIS — R519 Headache, unspecified: Secondary | ICD-10-CM

## 2016-11-30 DIAGNOSIS — M542 Cervicalgia: Secondary | ICD-10-CM | POA: Diagnosis not present

## 2016-11-30 DIAGNOSIS — R51 Headache: Secondary | ICD-10-CM | POA: Diagnosis not present

## 2016-11-30 DIAGNOSIS — M62838 Other muscle spasm: Secondary | ICD-10-CM | POA: Diagnosis not present

## 2016-11-30 LAB — POCT SEDIMENTATION RATE: POCT SED RATE: 18 mm/h (ref 0–22)

## 2016-11-30 MED ORDER — MELOXICAM 7.5 MG PO TABS: 7.5000 mg | ORAL_TABLET | Freq: Every day | ORAL | 0 refills | Status: DC | PRN

## 2016-11-30 MED ORDER — TRAMADOL HCL 50 MG PO TABS: 25.0000 mg | ORAL_TABLET | Freq: Three times a day (TID) | ORAL | 0 refills | Status: DC | PRN

## 2016-11-30 MED ORDER — CYCLOBENZAPRINE HCL 5 MG PO TABS: 5.0000 mg | ORAL_TABLET | Freq: Three times a day (TID) | ORAL | 0 refills | Status: DC | PRN

## 2016-11-30 NOTE — Progress Notes (Signed)
Subjective:  By signing my name below, I, Janice Hamilton, attest that this documentation has been prepared under the direction and in the presence of Janice Ray, MD. Electronically Signed: Moises Hamilton, [place]. 11/30/2016 , 10:19 AM .  Patient was seen in Room 13 .   Patient ID: Janice Hamilton, female    DOB: 09/03/1950, 66 y.o.   MRN: [id number] Chief Complaint  Patient presents with  . Neck Pain    (Uses a new sleep apnea machine)  . Headache  . Shoulder Pain    Right Side   HPI Janice Hamilton is a 66 y.o. female  Patient complains of right sided headache that started about 2 weeks ago. She states that her pain starts at the right side of her neck and goes up to her scalp. When she turns her head to the right, it "catches" and she feels some pain. She noticed her eyes watering initially about 2 weeks ago, and watered for about 4-5 days. She denies any blurry vision or darkening vision in her right eye. She denies weakness or pain radiating down her right arm. She denies any known injury. She mentions seeing Dr. Linna Hamilton in the past for knots in the right side of her neck about 5-6 years ago; she denies feeling any knots over the area at present.   She reports being unable to wear her cpap mask for the past 3 nights, due to mask pressing over the area of her head. She's still smoking, and tried quitting in the past without success. She would like to try to quit. She informs being a "side-sleeper", and has tried sleeping on another side and with other pillows without improvement. She's taken 4 advil (800mg ) every 5-6 hours, but not consistently; mentions haven't taken any advil in a couple of days due to without any improvement of symptoms.   She has a history of bursitis and requests refill of tramadol and meloxicam as well. She denies taking her meloxicam with the ibuprofen. She has tried taking about 3 pills of tramadol (half pill each time) intermittently without relief. She notes taking  half tablets of meloxicam.   Patient Active Problem List   Diagnosis Date Noted  . Bronchitis with bronchospasm 10/21/2016  . OSA on CPAP 10/21/2016  . UARS (upper airway resistance syndrome) 10/21/2016   Past Medical History:  Diagnosis Date  . Allergy   . Anxiety   . Hamilton transfusion without reported diagnosis   . Depression   . Hypertension    Past Surgical History:  Procedure Laterality Date  . ABDOMINAL HYSTERECTOMY     No Known Allergies Prior to Admission medications   Not on File   Social History   Socioeconomic History  . Marital status: Legally Separated    Spouse name: Not on file  . Number of children: Not on file  . Years of education: Not on file  . Highest education level: Not on file  Social Needs  . Financial resource strain: Not on file  . Food insecurity - worry: Not on file  . Food insecurity - inability: Not on file  . Transportation needs - medical: Not on file  . Transportation needs - non-medical: Not on file  Occupational History  . Not on file  Tobacco Use  . Smoking status: Current Every Day Smoker  . Smokeless tobacco: Never Used  Substance and Sexual Activity  . Alcohol use: No  . Drug use: No  . Sexual activity: Not on file  Other  Topics Concern  . Not on file  Social History Narrative  . Not on file   Review of Systems  Constitutional: Negative for chills, fatigue, fever and unexpected weight change.  Respiratory: Negative for cough.   Gastrointestinal: Negative for abdominal pain, constipation, diarrhea, nausea and vomiting.  Musculoskeletal: Positive for arthralgias and neck pain.  Skin: Negative for rash and wound.  Neurological: Positive for headaches. Negative for dizziness and weakness.       Objective:   Physical Exam  Constitutional: She is oriented to person, place, and time. She appears well-developed and well-nourished. No distress.  HENT:  Head: Normocephalic and atraumatic.  Eyes: EOM are normal. Pupils  are equal, round, and reactive to light.  Neck: Neck supple.  Cardiovascular: Normal rate.  Pulmonary/Chest: Effort normal. No respiratory distress.  Musculoskeletal: Normal range of motion.  C-spine: slight tenderness over mid lower C-spine, some spasm and tenderness into the right trapezius, some pain with extension and decreased extension; full rotation to the right but with pain, pain free rotation to the left  Neurological: She is alert and oriented to person, place, and time.  Reflex Scores:      Tricep reflexes are 1+ on the right side and 1+ on the left side.      Bicep reflexes are 2+ on the right side and 2+ on the left side.      Brachioradialis reflexes are 2+ on the right side and 2+ on the left side. Skin: Skin is warm and dry.  Psychiatric: She has a normal mood and affect. Her behavior is normal.  Nursing note and vitals reviewed.   Vitals:   11/30/16 0950  BP: 135/86  Pulse: 84  Resp: 18  Temp: 98.4 F (36.9 C)  TempSrc: Oral  SpO2: 96%  Weight: 160 lb 12.8 oz (72.9 kg)  Height: 5\' 2"  (1.575 m)   Dg Cervical Spine Complete  Result Date: 11/30/2016 CLINICAL DATA:  Cervicalgia EXAM: CERVICAL SPINE - COMPLETE 4+ VIEW COMPARISON:  None. FINDINGS: Frontal, lateral, open-mouth odontoid, and bilateral oblique views were obtained. There is no evident fracture or spondylolisthesis. Prevertebral soft tissues and predental space regions are normal. Disc spaces appear unremarkable. There is no appreciable exit foraminal narrowing on the oblique views. Lung apices are clear. IMPRESSION: No fracture or spondylolisthesis. No appreciable arthropathic change. Electronically Signed   By: Lowella Grip III M.D.   On: 11/30/2016 10:43       Assessment & Plan:   Janice Hamilton is a 66 y.o. female Neck pain on right side - Plan: DG Cervical Spine Complete Right-sided headache - Plan: POCT SEDIMENTATION RATE Neck muscle spasm - Plan: DG Cervical Spine Complete  -No known injury.  May be related to change in sleep position with new mask. Initial presentation of headache and watery eye differential includes cluster headache, but primarily symptoms at present are cervical. Also less likely temporal arteritis given neck symptoms at present, but will check sedimentation rate.  -Heat, ice for muscle spasm, Flexeril 3 times a day when necessary, potential side effects discussed. Ttramadol written for more severe pain if needed, but side effects were discussed and cautioned against combo with other sedating medications.  -Meloxicam 7.5 mg daily when necessary, potential side effects discussed, do not combine with NSAIDs, and cautioned on excessive NSAID use in the future.  -Recheck in the next 1 week if not improving, sooner if worse.  Tobacco abuse  -Cessation discussed, resources provided, can follow-up with myself or PCP to discuss  other options if needed.  Meds ordered this encounter  Medications  . cetirizine (ZYRTEC) 10 MG tablet    Sig: Take 10 mg daily by mouth.  . meloxicam (MOBIC) 7.5 MG tablet    Sig: Take 1 tablet (7.5 mg total) daily as needed by mouth for pain.    Dispense:  15 tablet    Refill:  0  . cyclobenzaprine (FLEXERIL) 5 MG tablet    Sig: Take 1 tablet (5 mg total) 3 (three) times daily as needed by mouth. Start with bedtime as needed due to sedation    Dispense:  15 tablet    Refill:  0  . traMADol (ULTRAM) 50 MG tablet    Sig: Take 0.5-1 tablets (25-50 mg total) every 8 (eight) hours as needed by mouth.    Dispense:  15 tablet    Refill:  0   Patient Instructions   Headache is likely related to some muscle spasm or nerve pain from your neck.  Okay to continue heat or ice to affected area, try changing position with sleeping as the mask may be contributing to positional discomfort. Muscle relaxant Flexeril up to every 8 hours as needed, but start at bedtime due to sedation and be careful with risk of falls with that medication. I did write for some  tramadol for stronger pain medication, but would recommend against combining that with other sedating medicines including the Flexeril. Be careful with ultram as well due to risk of sedation and falls. Meloxicam 1 pill per day as needed for the next week or 2, and do not combine with other NSAIDs such as Advil or Aleve.  If not improving within the next 1 week, or worsening sooner, please return for recheck to decide on next step.  [place] offers smoking cessation clinics. Registration is required. To register call [phone] or register online at [email or web address] return to discuss other options for smoking cessation if needed.    General Headache Without Cause A headache is pain or discomfort felt around the head or neck area. There are many causes and types of headaches. In some cases, the cause may not be found. Follow these instructions at home: Managing pain  Take over-the-counter and prescription medicines only as told by your doctor.  Lie down in a dark, quiet room when you have a headache.  If directed, apply ice to the head and neck area: ? Put ice in a plastic bag. ? Place a towel between your skin and the bag. ? Leave the ice on for 20 minutes, 2-3 times per day.  Use a heating pad or hot shower to apply heat to the head and neck area as told by your doctor.  Keep lights dim if bright lights bother you or make your headaches worse. Eating and drinking  Eat meals on a regular schedule.  Lessen how much alcohol you drink.  Lessen how much caffeine you drink, or stop drinking caffeine. General instructions  Keep all follow-up visits as told by your doctor. This is important.  Keep a journal to find out if certain things bring on headaches. For example, write down: ? What you eat and drink. ? How much sleep you get. ? Any change to your diet or medicines.  Relax by getting a massage or doing other relaxing activities.  Lessen stress.  Sit up straight. Do  not tighten (tense) your muscles.  Do not use tobacco products. This includes cigarettes, chewing tobacco, or e-cigarettes. If you need help quitting,  ask your doctor.  Exercise regularly as told by your doctor.  Get enough sleep. This often means 7-9 hours of sleep. Contact a doctor if:  Your symptoms are not helped by medicine.  You have a headache that feels different than the other headaches.  You feel sick to your stomach (nauseous) or you throw up (vomit).  You have a fever. Get help right away if:  Your headache becomes really bad.  You keep throwing up.  You have a stiff neck.  You have trouble seeing.  You have trouble speaking.  You have pain in the eye or ear.  Your muscles are weak or you lose muscle control.  You lose your balance or have trouble walking.  You feel like you will pass out (faint) or you pass out.  You have confusion. This information is not intended to replace advice given to you by your health care provider. Make sure you discuss any questions you have with your health care provider. Document Released: 10/17/2007 Document Revised: 06/15/2015 Document Reviewed: 05/02/2014 Elsevier Interactive Patient Education  2018 [employer].    Steps to Quit Smoking Smoking tobacco can be bad for your health. It can also affect almost every organ in your body. Smoking puts you and people around you at risk for many serious long-lasting (chronic) diseases. Quitting smoking is hard, but it is one of the best things that you can do for your health. It is never too late to quit. What are the benefits of quitting smoking? When you quit smoking, you lower your risk for getting serious diseases and conditions. They can include:  Lung cancer or lung disease.  Heart disease.  Stroke.  Heart attack.  Not being able to have children (infertility).  Weak bones (osteoporosis) and broken bones (fractures).  If you have coughing, wheezing, and shortness  of breath, those symptoms may get better when you quit. You may also get sick less often. If you are pregnant, quitting smoking can help to lower your chances of having a baby of low birth weight. What can I do to help me quit smoking? Talk with your doctor about what can help you quit smoking. Some things you can do (strategies) include:  Quitting smoking totally, instead of slowly cutting back how much you smoke over a period of time.  Going to in-person counseling. You are more likely to quit if you go to many counseling sessions.  Using resources and support systems, such as: ? Database administrator with a Social worker. ? Phone quitlines. ? Careers information officer. ? Support groups or group counseling. ? Text messaging programs. ? Mobile phone apps or applications.  Taking medicines. Some of these medicines may have nicotine in them. If you are pregnant or breastfeeding, do not take any medicines to quit smoking unless your doctor says it is okay. Talk with your doctor about counseling or other things that can help you.  Talk with your doctor about using more than one strategy at the same time, such as taking medicines while you are also going to in-person counseling. This can help make quitting easier. What things can I do to make it easier to quit? Quitting smoking might feel very hard at first, but there is a lot that you can do to make it easier. Take these steps:  Talk to your family and friends. Ask them to support and encourage you.  Call phone quitlines, reach out to support groups, or work with a Social worker.  Ask people who smoke  to not smoke around you.  Avoid places that make you want (trigger) to smoke, such as: ? Bars. ? Parties. ? Smoke-break areas at work.  Spend time with people who do not smoke.  Lower the stress in your life. Stress can make you want to smoke. Try these things to help your stress: ? Getting regular exercise. ? Deep-breathing  exercises. ? Yoga. ? Meditating. ? Doing a body scan. To do this, close your eyes, focus on one area of your body at a time from head to toe, and notice which parts of your body are tense. Try to relax the muscles in those areas.  Download or buy apps on your mobile phone or tablet that can help you stick to your quit plan. There are many free apps, such as QuitGuide from the [employer] Office manager for Disease Control and Prevention). You can find more support from smokefree.gov and other websites.  This information is not intended to replace advice given to you by your health care provider. Make sure you discuss any questions you have with your health care provider. Document Released: 11/03/2008 Document Revised: 09/05/2015 Document Reviewed: 05/24/2014 Elsevier Interactive Patient Education  2018 [place] with Quitting Smoking Quitting smoking is a physical and mental challenge. You will face cravings, withdrawal symptoms, and temptation. Before quitting, work with your health care provider to make a plan that can help you cope. Preparation can help you quit and keep you from giving in. How can I cope with cravings? Cravings usually last for 5-10 minutes. If you get through it, the craving will pass. Consider taking the following actions to help you cope with cravings:  Keep your mouth busy: ? Chew sugar-free gum. ? Suck on hard candies or a straw. ? Brush your teeth.  Keep your hands and body busy: ? Immediately change to a different activity when you feel a craving. ? Squeeze or play with a ball. ? Do an activity or a hobby, like making bead jewelry, practicing needlepoint, or working with wood. ? Mix up your normal routine. ? Take a short exercise break. Go for a quick walk or run up and down stairs. ? Spend time in public places where smoking is not allowed.  Focus on doing something kind or helpful for someone else.  Call a friend or family member to talk during a  craving.  Join a support group.  Call a quit line, such as 1-800-QUIT-NOW.  Talk with your health care provider about medicines that might help you cope with cravings and make quitting easier for you.  How can I deal with withdrawal symptoms? Your body may experience negative effects as it tries to get used to not having nicotine in the system. These effects are called withdrawal symptoms. They may include:  Feeling hungrier than normal.  Trouble concentrating.  Irritability.  Trouble sleeping.  Feeling depressed.  Restlessness and agitation.  Craving a cigarette.  To manage withdrawal symptoms:  Avoid places, people, and activities that trigger your cravings.  Remember why you want to quit.  Get plenty of sleep.  Avoid coffee and other caffeinated drinks. These may worsen some of your symptoms.  How can I handle social situations? Social situations can be difficult when you are quitting smoking, especially in the first few weeks. To manage this, you can:  Avoid parties, bars, and other social situations where people might be smoking.  Avoid alcohol.  Leave right away if you have the urge to smoke.  Explain  to your family and friends that you are quitting smoking. Ask for understanding and support.  Plan activities with friends or family where smoking is not an option.  What are some ways I can cope with stress? Wanting to smoke may cause stress, and stress can make you want to smoke. Find ways to manage your stress. Relaxation techniques can help. For example:  Breathe slowly and deeply, in through your nose and out through your mouth.  Listen to soothing, relaxing music.  Talk with a family member or friend about your stress.  Light a candle.  Soak in a bath or take a shower.  Think about a peaceful place.  What are some ways I can prevent weight gain? Be aware that many people gain weight after they quit smoking. However, not everyone does. To keep  from gaining weight, have a plan in place before you quit and stick to the plan after you quit. Your plan should include:  Having healthy snacks. When you have a craving, it may help to: ? Eat plain popcorn, crunchy carrots, celery, or other cut vegetables. ? Chew sugar-free gum.  Changing how you eat: ? Eat small portion sizes at meals. ? Eat 4-6 small meals throughout the day instead of 1-2 large meals a day. ? Be mindful when you eat. Do not watch television or do other things that might distract you as you eat.  Exercising regularly: ? Make time to exercise each day. If you do not have time for a long workout, do short bouts of exercise for 5-10 minutes several times a day. ? Do some form of strengthening exercise, like weight lifting, and some form of aerobic exercise, like running or swimming.  Drinking plenty of water or other low-calorie or no-calorie drinks. Drink 6-8 glasses of water daily, or as much as instructed by your health care provider.  Summary  Quitting smoking is a physical and mental challenge. You will face cravings, withdrawal symptoms, and temptation to smoke again. Preparation can help you as you go through these challenges.  You can cope with cravings by keeping your mouth busy (such as by chewing gum), keeping your body and hands busy, and making calls to family, friends, or a helpline for people who want to quit smoking.  You can cope with withdrawal symptoms by avoiding places where people smoke, avoiding drinks with caffeine, and getting plenty of rest.  Ask your health care provider about the different ways to prevent weight gain, avoid stress, and handle social situations. This information is not intended to replace advice given to you by your health care provider. Make sure you discuss any questions you have with your health care provider. Document Released: 01/05/2016 Document Revised: 01/05/2016 Document Reviewed: 01/05/2016 Elsevier Interactive Patient  Education  2018 [employer].    IF you received an x-Hamilton today, you will receive an invoice from [facility]. Please contact [facility] at [phone] with questions or concerns regarding your invoice.   IF you received labwork today, you will receive an invoice from [facility]. Please contact [facility] at [phone] with questions or concerns regarding your invoice.   Our billing staff will not be able to assist you with questions regarding bills from these companies.  You will be contacted with the lab results as soon as they are available. The fastest way to get your results is to activate your My Chart account. Instructions are located on the last page of this paperwork. If you have not heard from [place]  regarding the results in 2 weeks, please contact this office.      I personally performed the services described in this documentation, which was scribed in my presence. The recorded information has been reviewed and considered for accuracy and completeness, addended by me as needed, and agree with information above.  Signed,   Janice Ray, MD [facility].  11/30/16 11:07 AM

## 2016-11-30 NOTE — Patient Instructions (Addendum)
Headache is likely related to some muscle spasm or nerve pain from your neck.  Okay to continue heat or ice to affected area, try changing position with sleeping as the mask may be contributing to positional discomfort. Muscle relaxant Flexeril up to every 8 hours as needed, but start at bedtime due to sedation and be careful with risk of falls with that medication. I did write for some tramadol for stronger pain medication, but would recommend against combining that with other sedating medicines including the Flexeril. Be careful with ultram as well due to risk of sedation and falls. Meloxicam 1 pill per day as needed for the next week or 2, and do not combine with other NSAIDs such as Advil or Aleve.  If not improving within the next 1 week, or worsening sooner, please return for recheck to decide on next step.  [facility] offers smoking cessation clinics. Registration is required. To register call [phone] or register online at [email or web address] return to discuss other options for smoking cessation if needed.    General Headache Without Cause A headache is pain or discomfort felt around the head or neck area. There are many causes and types of headaches. In some cases, the cause may not be found. Follow these instructions at home: Managing pain  Take over-the-counter and prescription medicines only as told by your doctor.  Lie down in a dark, quiet room when you have a headache.  If directed, apply ice to the head and neck area: ? Put ice in a plastic bag. ? Place a towel between your skin and the bag. ? Leave the ice on for 20 minutes, 2-3 times per day.  Use a heating pad or hot shower to apply heat to the head and neck area as told by your doctor.  Keep lights dim if bright lights bother you or make your headaches worse. Eating and drinking  Eat meals on a regular schedule.  Lessen how much alcohol you drink.  Lessen how much caffeine you drink, or stop drinking  caffeine. General instructions  Keep all follow-up visits as told by your doctor. This is important.  Keep a journal to find out if certain things bring on headaches. For example, write down: ? What you eat and drink. ? How much sleep you get. ? Any change to your diet or medicines.  Relax by getting a massage or doing other relaxing activities.  Lessen stress.  Sit up straight. Do not tighten (tense) your muscles.  Do not use tobacco products. This includes cigarettes, chewing tobacco, or e-cigarettes. If you need help quitting, ask your doctor.  Exercise regularly as told by your doctor.  Get enough sleep. This often means 7-9 hours of sleep. Contact a doctor if:  Your symptoms are not helped by medicine.  You have a headache that feels different than the other headaches.  You feel sick to your stomach (nauseous) or you throw up (vomit).  You have a fever. Get help right away if:  Your headache becomes really bad.  You keep throwing up.  You have a stiff neck.  You have trouble seeing.  You have trouble speaking.  You have pain in the eye or ear.  Your muscles are weak or you lose muscle control.  You lose your balance or have trouble walking.  You feel like you will pass out (faint) or you pass out.  You have confusion. This information is not intended to replace advice given to you by your health care  provider. Make sure you discuss any questions you have with your health care provider. Document Released: 10/17/2007 Document Revised: 06/15/2015 Document Reviewed: 05/02/2014 Elsevier Interactive Patient Education  2018 [employer].    Steps to Quit Smoking Smoking tobacco can be bad for your health. It can also affect almost every organ in your body. Smoking puts you and people around you at risk for many serious long-lasting (chronic) diseases. Quitting smoking is hard, but it is one of the best things that you can do for your health. It is never too  late to quit. What are the benefits of quitting smoking? When you quit smoking, you lower your risk for getting serious diseases and conditions. They can include:  Lung cancer or lung disease.  Heart disease.  Stroke.  Heart attack.  Not being able to have children (infertility).  Weak bones (osteoporosis) and broken bones (fractures).  If you have coughing, wheezing, and shortness of breath, those symptoms may get better when you quit. You may also get sick less often. If you are pregnant, quitting smoking can help to lower your chances of having a baby of low birth weight. What can I do to help me quit smoking? Talk with your doctor about what can help you quit smoking. Some things you can do (strategies) include:  Quitting smoking totally, instead of slowly cutting back how much you smoke over a period of time.  Going to in-person counseling. You are more likely to quit if you go to many counseling sessions.  Using resources and support systems, such as: ? Database administrator with a Social worker. ? Phone quitlines. ? Careers information officer. ? Support groups or group counseling. ? Text messaging programs. ? Mobile phone apps or applications.  Taking medicines. Some of these medicines may have nicotine in them. If you are pregnant or breastfeeding, do not take any medicines to quit smoking unless your doctor says it is okay. Talk with your doctor about counseling or other things that can help you.  Talk with your doctor about using more than one strategy at the same time, such as taking medicines while you are also going to in-person counseling. This can help make quitting easier. What things can I do to make it easier to quit? Quitting smoking might feel very hard at first, but there is a lot that you can do to make it easier. Take these steps:  Talk to your family and friends. Ask them to support and encourage you.  Call phone quitlines, reach out to support groups, or work with  a Social worker.  Ask people who smoke to not smoke around you.  Avoid places that make you want (trigger) to smoke, such as: ? Bars. ? Parties. ? Smoke-break areas at work.  Spend time with people who do not smoke.  Lower the stress in your life. Stress can make you want to smoke. Try these things to help your stress: ? Getting regular exercise. ? Deep-breathing exercises. ? Yoga. ? Meditating. ? Doing a body scan. To do this, close your eyes, focus on one area of your body at a time from head to toe, and notice which parts of your body are tense. Try to relax the muscles in those areas.  Download or buy apps on your mobile phone or tablet that can help you stick to your quit plan. There are many free apps, such as QuitGuide from the [employer] Office manager for Disease Control and Prevention). You can find more support from smokefree.gov and other  websites.  This information is not intended to replace advice given to you by your health care provider. Make sure you discuss any questions you have with your health care provider. Document Released: 11/03/2008 Document Revised: 09/05/2015 Document Reviewed: 05/24/2014 Elsevier Interactive Patient Education  2018 [place] with Quitting Smoking Quitting smoking is a physical and mental challenge. You will face cravings, withdrawal symptoms, and temptation. Before quitting, work with your health care provider to make a plan that can help you cope. Preparation can help you quit and keep you from giving in. How can I cope with cravings? Cravings usually last for 5-10 minutes. If you get through it, the craving will pass. Consider taking the following actions to help you cope with cravings:  Keep your mouth busy: ? Chew sugar-free gum. ? Suck on hard candies or a straw. ? Brush your teeth.  Keep your hands and body busy: ? Immediately change to a different activity when you feel a craving. ? Squeeze or play with a ball. ? Do an activity or  a hobby, like making bead jewelry, practicing needlepoint, or working with wood. ? Mix up your normal routine. ? Take a short exercise break. Go for a quick walk or run up and down stairs. ? Spend time in public places where smoking is not allowed.  Focus on doing something kind or helpful for someone else.  Call a friend or family member to talk during a craving.  Join a support group.  Call a quit line, such as 1-800-QUIT-NOW.  Talk with your health care provider about medicines that might help you cope with cravings and make quitting easier for you.  How can I deal with withdrawal symptoms? Your body may experience negative effects as it tries to get used to not having nicotine in the system. These effects are called withdrawal symptoms. They may include:  Feeling hungrier than normal.  Trouble concentrating.  Irritability.  Trouble sleeping.  Feeling depressed.  Restlessness and agitation.  Craving a cigarette.  To manage withdrawal symptoms:  Avoid places, people, and activities that trigger your cravings.  Remember why you want to quit.  Get plenty of sleep.  Avoid coffee and other caffeinated drinks. These may worsen some of your symptoms.  How can I handle social situations? Social situations can be difficult when you are quitting smoking, especially in the first few weeks. To manage this, you can:  Avoid parties, bars, and other social situations where people might be smoking.  Avoid alcohol.  Leave right away if you have the urge to smoke.  Explain to your family and friends that you are quitting smoking. Ask for understanding and support.  Plan activities with friends or family where smoking is not an option.  What are some ways I can cope with stress? Wanting to smoke may cause stress, and stress can make you want to smoke. Find ways to manage your stress. Relaxation techniques can help. For example:  Breathe slowly and deeply, in through your  nose and out through your mouth.  Listen to soothing, relaxing music.  Talk with a family member or friend about your stress.  Light a candle.  Soak in a bath or take a shower.  Think about a peaceful place.  What are some ways I can prevent weight gain? Be aware that many people gain weight after they quit smoking. However, not everyone does. To keep from gaining weight, have a plan in place before you quit and stick to  the plan after you quit. Your plan should include:  Having healthy snacks. When you have a craving, it may help to: ? Eat plain popcorn, crunchy carrots, celery, or other cut vegetables. ? Chew sugar-free gum.  Changing how you eat: ? Eat small portion sizes at meals. ? Eat 4-6 small meals throughout the day instead of 1-2 large meals a day. ? Be mindful when you eat. Do not watch television or do other things that might distract you as you eat.  Exercising regularly: ? Make time to exercise each day. If you do not have time for a long workout, do short bouts of exercise for 5-10 minutes several times a day. ? Do some form of strengthening exercise, like weight lifting, and some form of aerobic exercise, like running or swimming.  Drinking plenty of water or other low-calorie or no-calorie drinks. Drink 6-8 glasses of water daily, or as much as instructed by your health care provider.  Summary  Quitting smoking is a physical and mental challenge. You will face cravings, withdrawal symptoms, and temptation to smoke again. Preparation can help you as you go through these challenges.  You can cope with cravings by keeping your mouth busy (such as by chewing gum), keeping your body and hands busy, and making calls to family, friends, or a helpline for people who want to quit smoking.  You can cope with withdrawal symptoms by avoiding places where people smoke, avoiding drinks with caffeine, and getting plenty of rest.  Ask your health care provider about the  different ways to prevent weight gain, avoid stress, and handle social situations. This information is not intended to replace advice given to you by your health care provider. Make sure you discuss any questions you have with your health care provider. Document Released: 01/05/2016 Document Revised: 01/05/2016 Document Reviewed: 01/05/2016 Elsevier Interactive Patient Education  2018 [employer].    IF you received an x-ray today, you will receive an invoice from [facility]. Please contact [facility] at [phone] with questions or concerns regarding your invoice.   IF you received labwork today, you will receive an invoice from [facility]. Please contact [facility] at [phone] with questions or concerns regarding your invoice.   Our billing staff will not be able to assist you with questions regarding bills from these companies.  You will be contacted with the lab results as soon as they are available. The fastest way to get your results is to activate your My Chart account. Instructions are located on the last page of this paperwork. If you have not heard from [place] regarding the results in 2 weeks, please contact this office.

## 2016-12-27 ENCOUNTER — Ambulatory Visit (INDEPENDENT_AMBULATORY_CARE_PROVIDER_SITE_OTHER): Payer: Medicare Other

## 2016-12-27 ENCOUNTER — Encounter: Payer: Self-pay | Admitting: Physician Assistant

## 2016-12-27 ENCOUNTER — Other Ambulatory Visit: Payer: Self-pay

## 2016-12-27 ENCOUNTER — Ambulatory Visit (INDEPENDENT_AMBULATORY_CARE_PROVIDER_SITE_OTHER): Payer: Medicare Other | Admitting: Physician Assistant

## 2016-12-27 VITALS — BP 128/86 | HR 86 | Temp 98.3°F | Resp 18 | Ht 62.0 in | Wt 162.0 lb

## 2016-12-27 VITALS — BP 124/80 | HR 88 | Ht 62.0 in | Wt 160.5 lb

## 2016-12-27 DIAGNOSIS — M62838 Other muscle spasm: Secondary | ICD-10-CM

## 2016-12-27 DIAGNOSIS — Z0000 Encounter for general adult medical examination without abnormal findings: Secondary | ICD-10-CM | POA: Diagnosis not present

## 2016-12-27 DIAGNOSIS — M542 Cervicalgia: Secondary | ICD-10-CM | POA: Diagnosis not present

## 2016-12-27 MED ORDER — MELOXICAM 7.5 MG PO TABS: 7.5000 mg | ORAL_TABLET | Freq: Every day | ORAL | 0 refills | Status: DC

## 2016-12-27 MED ORDER — CYCLOBENZAPRINE HCL 10 MG PO TABS: 5.0000 mg | ORAL_TABLET | Freq: Three times a day (TID) | ORAL | 0 refills | Status: DC | PRN

## 2016-12-27 MED ORDER — TRAMADOL HCL 50 MG PO TABS: 50.0000 mg | ORAL_TABLET | Freq: Three times a day (TID) | ORAL | 0 refills | Status: DC | PRN

## 2016-12-27 NOTE — Progress Notes (Signed)
12/27/2016 11:27 AM   DOB: [birth year] / MRN: [id number]  SUBJECTIVE:  Janice Hamilton is a 66 y.o. female presenting for right neck and trap pain.  meloxicam and tramadol have helped.  Pain worse in the morning due to sleeping position with CPAP.  No numbness or tingling in the hand.  Most recent neck rads normal. Says "my neck is just so tight, and it hurts all the time."   She has No Known Allergies.   She  has a past medical history of Allergy, Anxiety, Blood transfusion without reported diagnosis, Depression, and Hypertension.    She  reports that she has been smoking.  she has never used smokeless tobacco. She reports that she does not drink alcohol or use drugs. She  has no sexual activity history on file. The patient  has a past surgical history that includes Abdominal hysterectomy.  Her family history includes Diabetes in her mother; Hypothyroidism in her sister; Stroke in her father.  Review of Systems  Constitutional: Negative for chills, diaphoresis and fever.  Respiratory: Negative for cough, hemoptysis, sputum production, shortness of breath and wheezing.   Cardiovascular: Negative for chest pain, orthopnea and leg swelling.  Gastrointestinal: Negative for nausea.  Skin: Negative for rash.  Neurological: Negative for dizziness, tingling and focal weakness.    The problem list and medications were reviewed and updated by myself where necessary and exist elsewhere in the encounter.   OBJECTIVE:  BP 128/86 (BP Location: Right Arm, Patient Position: Sitting, Cuff Size: Large)   Pulse 86   Temp 98.3 F (36.8 C) (Oral)   Resp 18   Ht 5\' 2"  (1.575 m)   Wt 162 lb (73.5 kg)   SpO2 96%   BMI 29.63 kg/m   Lab Results  Component Value Date   HGBA1C 6.4 04/20/2016     Physical Exam  Constitutional: She is oriented to person, place, and time. She is active.  Non-toxic appearance.  Eyes: EOM are normal. Pupils are equal, round, and reactive to light.  Cardiovascular:  Normal rate, regular rhythm, S1 normal, S2 normal, normal heart sounds and intact distal pulses. Exam reveals no gallop, no friction rub and no decreased pulses.  No murmur heard. Pulmonary/Chest: Effort normal. No stridor. No tachypnea. No respiratory distress. She has no wheezes. She has no rales.  Abdominal: She exhibits no distension.  Musculoskeletal: She exhibits tenderness (right trapezius). She exhibits no edema.  Neurological: She is alert and oriented to person, place, and time. She has normal strength and normal reflexes. She is not disoriented. She displays no atrophy. No cranial nerve deficit or sensory deficit. She exhibits normal muscle tone. Coordination and gait normal.  Skin: Skin is warm and dry. She is not diaphoretic. No pallor.  Psychiatric: Her behavior is normal.   BP Readings from Last 3 Encounters:  12/27/16 128/86  11/30/16 135/86  10/21/16 (!) 149/96   Procedure: Patient prepped with alcohol and cold spray.  3 mls of 0.5% bupivicane injected into the right trapezius muscle belly at the point of maximal TTP using a 1 inch 25 gauge needle.  No blood loss.  Patient tolerated procedure without complaint.   No results found for this or any previous visit (from the past 72 hour(s)).  No results found.  ASSESSMENT AND PLAN:  Maguadalupe was seen today for follow-up.  Diagnoses and all orders for this visit:  Neck pain on right side: Compliant with sleep apnea plan.  Will extend po therapy and tried some  trigger point injections today. RTC in 30 days if not improved.  -     traMADol (ULTRAM) 50 MG tablet; Take 1 tablet (50 mg total) by mouth every 8 (eight) hours as needed. Do not mix with benzodiazepines (xanax) -     meloxicam (MOBIC) 7.5 MG tablet; Take 1 tablet (7.5 mg total) by mouth daily. Do not mix with ibuprofen.  Trapezius muscle spasm -     cyclobenzaprine (FLEXERIL) 10 MG tablet; Take 0.5-1 tablets (5-10 mg total) by mouth 3 (three) times daily as needed for  muscle spasms. Do not mix with narcotics. May cause drowsiness.    The patient is advised to call or return to clinic if she does not see an improvement in symptoms, or to seek the care of the closest emergency department if she worsens with the above plan.   Philis Fendt, MHS, PA-C [facility] 12/27/2016 11:27 AM

## 2016-12-27 NOTE — Patient Instructions (Signed)
     IF you received an x-ray today, you will receive an invoice from [facility]. Please contact [facility] at [phone] with questions or concerns regarding your invoice.   IF you received labwork today, you will receive an invoice from [facility]. Please contact [facility] at [phone] with questions or concerns regarding your invoice.   Our billing staff will not be able to assist you with questions regarding bills from these companies.  You will be contacted with the lab results as soon as they are available. The fastest way to get your results is to activate your My Chart account. Instructions are located on the last page of this paperwork. If you have not heard from us regarding the results in 2 weeks, please contact this office.     

## 2016-12-27 NOTE — Patient Instructions (Addendum)
[name] , Thank you for taking time to come for your Medicare Wellness Visit. I appreciate your ongoing commitment to your health goals. Please review the following plan we discussed and let me know if I can assist you in the future.   Screening recommendations/referrals: Colonoscopy: declined, You state you do this thorough [facility].  Mammogram: declined, You state you do this thorough [facility]. Bone Density: declined, You state you do this thorough [facility]. Recommended yearly ophthalmology/optometry visit for glaucoma screening and checkup Recommended yearly dental visit for hygiene and checkup  Vaccinations: Influenza vaccine: declined Pneumococcal vaccine: declined Tdap vaccine: declined due to insurance Shingles vaccine: declined   Advanced directives: Advance directive discussed with you today. Even though you declined this today please call our office should you change your mind and we can give you the proper paperwork for you to fill out.   Conditions/risks identified: Try to get your weight down to around 120 lbs.   Next appointment: schedule follow up visit with PCP, 1 year for AWV   Preventive Care 9 Years and Older, Female Preventive care refers to lifestyle choices and visits with your health care provider that can promote health and wellness. What does preventive care include?  A yearly physical exam. This is also called an annual well check.  Dental exams once or twice a year.  Routine eye exams. Ask your health care provider how often you should have your eyes checked.  Personal lifestyle choices, including:  Daily care of your teeth and gums.  Regular physical activity.  Eating a healthy diet.  Avoiding tobacco and drug use.  Limiting alcohol use.  Practicing safe sex.  Taking low-dose aspirin every day.  Taking vitamin and mineral supplements as recommended by your health care provider. What happens during an annual well check? The services  and screenings done by your health care provider during your annual well check will depend on your age, overall health, lifestyle risk factors, and family history of disease. Counseling  Your health care provider may ask you questions about your:  Alcohol use.  Tobacco use.  Drug use.  Emotional well-being.  Home and relationship well-being.  Sexual activity.  Eating habits.  History of falls.  Memory and ability to understand (cognition).  Work and work Statistician.  Reproductive health. Screening  You may have the following tests or measurements:  Height, weight, and BMI.  Blood pressure.  Lipid and cholesterol levels. These may be checked every 5 years, or more frequently if you are over 16 years old.  Skin check.  Lung cancer screening. You may have this screening every year starting at age 60 if you have a 30-pack-year history of smoking and currently smoke or have quit within the past 15 years.  Fecal occult blood test (FOBT) of the stool. You may have this test every year starting at age 108.  Flexible sigmoidoscopy or colonoscopy. You may have a sigmoidoscopy every 5 years or a colonoscopy every 10 years starting at age 44.  Hepatitis C blood test.  Hepatitis B blood test.  Sexually transmitted disease (STD) testing.  Diabetes screening. This is done by checking your blood sugar (glucose) after you have not eaten for a while (fasting). You may have this done every 1-3 years.  Bone density scan. This is done to screen for osteoporosis. You may have this done starting at age 35.  Mammogram. This may be done every 1-2 years. Talk to your health care provider about how often you should have  regular mammograms. Talk with your health care provider about your test results, treatment options, and if necessary, the need for more tests. Vaccines  Your health care provider may recommend certain vaccines, such as:  Influenza vaccine. This is recommended every  year.  Tetanus, diphtheria, and acellular pertussis (Tdap, Td) vaccine. You may need a Td booster every 10 years.  Zoster vaccine. You may need this after age 35.  Pneumococcal 13-valent conjugate (PCV13) vaccine. One dose is recommended after age 26.  Pneumococcal polysaccharide (PPSV23) vaccine. One dose is recommended after age 76. Talk to your health care provider about which screenings and vaccines you need and how often you need them. This information is not intended to replace advice given to you by your health care provider. Make sure you discuss any questions you have with your health care provider. Document Released: 02/03/2015 Document Revised: 09/27/2015 Document Reviewed: 11/08/2014 Elsevier Interactive Patient Education  2017 [place] Prevention in the Home Falls can cause injuries. They can happen to people of all ages. There are many things you can do to make your home safe and to help prevent falls. What can I do on the outside of my home?  Regularly fix the edges of walkways and driveways and fix any cracks.  Remove anything that might make you trip as you walk through a door, such as a raised step or threshold.  Trim any bushes or trees on the path to your home.  Use bright outdoor lighting.  Clear any walking paths of anything that might make someone trip, such as rocks or tools.  Regularly check to see if handrails are loose or broken. Make sure that both sides of any steps have handrails.  Any raised decks and porches should have guardrails on the edges.  Have any leaves, snow, or ice cleared regularly.  Use sand or salt on walking paths during winter.  Clean up any spills in your garage right away. This includes oil or grease spills. What can I do in the bathroom?  Use night lights.  Install grab bars by the toilet and in the tub and shower. Do not use towel bars as grab bars.  Use non-skid mats or decals in the tub or shower.  If you  need to sit down in the shower, use a plastic, non-slip stool.  Keep the floor dry. Clean up any water that spills on the floor as soon as it happens.  Remove soap buildup in the tub or shower regularly.  Attach bath mats securely with double-sided non-slip rug tape.  Do not have throw rugs and other things on the floor that can make you trip. What can I do in the bedroom?  Use night lights.  Make sure that you have a light by your bed that is easy to reach.  Do not use any sheets or blankets that are too big for your bed. They should not hang down onto the floor.  Have a firm chair that has side arms. You can use this for support while you get dressed.  Do not have throw rugs and other things on the floor that can make you trip. What can I do in the kitchen?  Clean up any spills right away.  Avoid walking on wet floors.  Keep items that you use a lot in easy-to-reach places.  If you need to reach something above you, use a strong step stool that has a grab bar.  Keep electrical cords out of the  way.  Do not use floor polish or wax that makes floors slippery. If you must use wax, use non-skid floor wax.  Do not have throw rugs and other things on the floor that can make you trip. What can I do with my stairs?  Do not leave any items on the stairs.  Make sure that there are handrails on both sides of the stairs and use them. Fix handrails that are broken or loose. Make sure that handrails are as long as the stairways.  Check any carpeting to make sure that it is firmly attached to the stairs. Fix any carpet that is loose or worn.  Avoid having throw rugs at the top or bottom of the stairs. If you do have throw rugs, attach them to the floor with carpet tape.  Make sure that you have a light switch at the top of the stairs and the bottom of the stairs. If you do not have them, ask someone to add them for you. What else can I do to help prevent falls?  Wear shoes  that:  Do not have high heels.  Have rubber bottoms.  Are comfortable and fit you well.  Are closed at the toe. Do not wear sandals.  If you use a stepladder:  Make sure that it is fully opened. Do not climb a closed stepladder.  Make sure that both sides of the stepladder are locked into place.  Ask someone to hold it for you, if possible.  Clearly mark and make sure that you can see:  Any grab bars or handrails.  First and last steps.  Where the edge of each step is.  Use tools that help you move around (mobility aids) if they are needed. These include:  Canes.  Walkers.  Scooters.  Crutches.  Turn on the lights when you go into a dark area. Replace any light bulbs as soon as they burn out.  Set up your furniture so you have a clear path. Avoid moving your furniture around.  If any of your floors are uneven, fix them.  If there are any pets around you, be aware of where they are.  Review your medicines with your doctor. Some medicines can make you feel dizzy. This can increase your chance of falling. Ask your doctor what other things that you can do to help prevent falls. This information is not intended to replace advice given to you by your health care provider. Make sure you discuss any questions you have with your health care provider. Document Released: 11/03/2008 Document Revised: 06/15/2015 Document Reviewed: 02/11/2014 Elsevier Interactive Patient Education  2017 [employer].

## 2016-12-27 NOTE — Progress Notes (Signed)
Subjective:   Janice Hamilton is a 66 y.o. female who presents for an Initial Medicare Annual Wellness Visit.  Review of Systems    N/A  Cardiac Risk Factors include: advanced age (>27men, >65 women);sedentary lifestyle;smoking/ tobacco exposure     Objective:    Today's Vitals   12/27/16 1142  BP: 124/80  Pulse: 88  SpO2: 96%  Weight: 160 lb 8 oz (72.8 kg)  Height: 5\' 2"  (1.575 m)   Body mass index is 29.36 kg/m.  Advanced Directives 12/27/2016  Does Patient Have a Medical Advance Directive? No  Would patient like information on creating a medical advance directive? No - Patient declined    Current Medications (verified) Outpatient Encounter Medications as of 12/27/2016  Medication Sig  . cetirizine (ZYRTEC) 10 MG tablet Take 10 mg daily by mouth.  . cyclobenzaprine (FLEXERIL) 10 MG tablet Take 0.5-1 tablets (5-10 mg total) by mouth 3 (three) times daily as needed for muscle spasms. Do not mix with narcotics. May cause drowsiness.  . meloxicam (MOBIC) 7.5 MG tablet Take 1 tablet (7.5 mg total) by mouth daily. Do not mix with ibuprofen.  . traMADol (ULTRAM) 50 MG tablet Take 1 tablet (50 mg total) by mouth every 8 (eight) hours as needed. Do not mix with benzodiazepines (xanax)   No facility-administered encounter medications on file as of 12/27/2016.     Allergies (verified) Patient has no known allergies.   History: Past Medical History:  Diagnosis Date  . Allergy   . Anxiety   . Blood transfusion without reported diagnosis   . Depression   . Hypertension    Past Surgical History:  Procedure Laterality Date  . ABDOMINAL HYSTERECTOMY     Family History  Problem Relation Age of Onset  . Diabetes Mother   . Stroke Father   . Hypothyroidism Sister    Social History   Socioeconomic History  . Marital status: Divorced    Spouse name: None  . Number of children: 1  . Years of education: None  . Highest education level: High school graduate  Social Needs    . Financial resource strain: Not hard at all  . Food insecurity - worry: Never true  . Food insecurity - inability: Never true  . Transportation needs - medical: No  . Transportation needs - non-medical: No  Occupational History  . None  Tobacco Use  . Smoking status: Current Every Day Smoker    Packs/day: 0.50    Types: Cigarettes  . Smokeless tobacco: Never Used  Substance and Sexual Activity  . Alcohol use: No  . Drug use: No  . Sexual activity: None  Other Topics Concern  . None  Social History Narrative  . None    Tobacco Counseling Ready to quit: No Counseling given: Not Answered   Clinical Intake:  Pre-visit preparation completed: Yes  Pain : No/denies pain     Nutritional Status: BMI 25 -29 Overweight Nutritional Risks: None Diabetes: No  Activities of Daily Living: Independent Ambulation: Independent with device- listed below Home Assistive Devices/Equipment: CPAP, Eyeglasses Medication Administration: Independent Home Management: Independent  Barriers to Care Management & Learning: None  Do you feel unsafe in your current relationship?: No Do you feel physically threatened by others?: No Anyone hurting you at home, work, or school?: No Unable to ask?: No  How often do you need to have someone help you when you read instructions, pamphlets, or other written materials from your doctor or pharmacy?: 1 - Never What  is the last grade level you completed in school?: 12th grade  Interpreter Needed?: No  Information entered by :: Janice Grime, LPN   Activities of Daily Living In your present state of health, do you have any difficulty performing the following activities: 12/27/2016  Hearing? N  Vision? N  Difficulty concentrating or making decisions? Y  Comment Patient has problems with remembering things  Walking or climbing stairs? N  Dressing or bathing? N  Doing errands, shopping? N  Preparing Food and eating ? N  Using the Toilet? N   In the past six months, have you accidently leaked urine? Y  Comment Patient has stress incontinence  Do you have problems with loss of bowel control? N  Managing your Medications? N  Managing your Finances? N  Housekeeping or managing your Housekeeping? N  Some recent data might be hidden    Timed Get Up and Go Performed yes, completed, within 30 seconds  Immunizations and Health Maintenance  There is no immunization history on file for this patient. Health Maintenance Due  Topic Date Due  . PAP SMEAR  01/09/1972    Patient Care Team: Janice Hamilton as PCP - General (Urgent Care)  Indicate any recent Medical Services you may have received from other than [facility] providers in the past year (date may be approximate).     Assessment:   This is a routine wellness examination for Janice Hamilton.   Hearing/Vision screen Hearing Screening Comments: Patient has not had a hearing exam in about 30 years Vision Screening Comments: Patient sees her eye doctor every 2-3 years for an eye exam.   Dietary issues and exercise activities discussed: Current Exercise Habits: The patient does not participate in regular exercise at present, Exercise limited by: None identified  Goals    . Weight (lb) < 120 lb (54.4 kg)     Patient states that she wants to try to get her weight down to around 120 lbs.       Depression Screen PHQ 2/9 Scores 12/27/2016 12/27/2016 11/30/2016 04/20/2016  PHQ - 2 Score 0 0 0 0    Fall Risk Fall Risk  12/27/2016 12/27/2016 11/30/2016 04/20/2016  Falls in the past year? No No No No    Is the patient's home free of loose throw rugs in walkways, pet beds, electrical cords, etc?   yes      Grab bars in the bathroom? yes      Handrails on the stairs?   yes      Adequate lighting?   yes  Cognitive Function:     6CIT Screen 12/27/2016  What Year? 0 points  What month? 0 points  What time? 0 points  Count back from 20 0 points  Months in reverse 0 points  Repeat  phrase 0 points  Total Score 0    Screening Tests Health Maintenance  Topic Date Due  . PAP SMEAR  01/09/1972  . INFLUENZA VACCINE  04/25/2017 (Originally 08/21/2016)  . MAMMOGRAM  12/27/2017 (Originally 01/08/2001)  . DEXA SCAN  12/27/2017 (Originally 01/09/2016)  . COLONOSCOPY  12/27/2017 (Originally 01/08/2001)  . TETANUS/TDAP  12/27/2017 (Originally 01/08/1970)  . Hepatitis C Screening  12/27/2017 (Originally [birth year])  . HIV Screening  12/27/2017 (Originally 01/08/1966)  . PNA vac Low Risk Adult (1 of 2 - PCV13) 12/27/2017 (Originally 01/09/2016)    Cancer Screenings: Lung:  Low Dose CT Chest recommended if Age 81-80 years, 30 pack-year currently smoking OR have quit w/in 15years. Patient does qualify,  but patient declined at this time Breast:  Up to date on Mammogram? No  Up to date of Bone Density/Dexa? No Colorectal:No,  Not up to date  Additional Screenings:  Hepatitis B/HIV/Syphillis: HIV declined, Hep B and Syphillis not indicated Hepatitis C Screening: declined     Plan:   I have personally reviewed and noted the following in the patient's chart:   . Medical and social history . Use of alcohol, tobacco or illicit drugs  . Current medications and supplements . Functional ability and status . Nutritional status . Physical activity . Advanced directives . List of other physicians . Hospitalizations, surgeries, and ER visits in previous 12 months . Vitals . Screenings to include cognitive, depression, and falls . Referrals and appointments  In addition, I have reviewed and discussed with patient certain preventive protocols, quality metrics, and best practice recommendations. A written personalized care plan for preventive services as well as general preventive health recommendations were provided to patient.   Patient declined colonoscopy, mammogram, bone density and low dose CT screen. Patient states that she has some of these services done through [facility].    Patient declined flu and pneumonia vaccines Patient declined Hep C and HIV screening.   1. Encounter for Medicare annual wellness exam   Janice Grime, LPN   [id number]

## 2017-04-25 ENCOUNTER — Other Ambulatory Visit: Payer: Self-pay

## 2017-04-25 ENCOUNTER — Ambulatory Visit (INDEPENDENT_AMBULATORY_CARE_PROVIDER_SITE_OTHER): Payer: Medicare Other

## 2017-04-25 ENCOUNTER — Ambulatory Visit (INDEPENDENT_AMBULATORY_CARE_PROVIDER_SITE_OTHER): Payer: Medicare Other | Admitting: Physician Assistant

## 2017-04-25 ENCOUNTER — Encounter: Payer: Self-pay | Admitting: Physician Assistant

## 2017-04-25 VITALS — BP 148/88 | HR 75 | Temp 97.5°F | Ht 59.0 in | Wt 162.6 lb

## 2017-04-25 DIAGNOSIS — R030 Elevated blood-pressure reading, without diagnosis of hypertension: Secondary | ICD-10-CM | POA: Diagnosis not present

## 2017-04-25 DIAGNOSIS — J302 Other seasonal allergic rhinitis: Secondary | ICD-10-CM

## 2017-04-25 DIAGNOSIS — R7303 Prediabetes: Secondary | ICD-10-CM | POA: Diagnosis not present

## 2017-04-25 DIAGNOSIS — M25561 Pain in right knee: Secondary | ICD-10-CM

## 2017-04-25 DIAGNOSIS — M62838 Other muscle spasm: Secondary | ICD-10-CM | POA: Diagnosis not present

## 2017-04-25 DIAGNOSIS — G8929 Other chronic pain: Secondary | ICD-10-CM

## 2017-04-25 MED ORDER — CYCLOBENZAPRINE HCL 10 MG PO TABS: 5.0000 mg | ORAL_TABLET | Freq: Three times a day (TID) | ORAL | 3 refills | Status: DC | PRN

## 2017-04-25 MED ORDER — CETIRIZINE HCL 10 MG PO TABS: 10.0000 mg | ORAL_TABLET | Freq: Every day | ORAL | 3 refills | Status: DC

## 2017-04-25 NOTE — Progress Notes (Signed)
04/25/2017 9:26 AM   DOB: Dec 28, 1950 / MRN: [id number]  SUBJECTIVE:  Janice Hamilton is a 67 y.o. female presenting for right posterior knee pain.  The pain is worse in the evening and gets worse with ambulating after rest which eventually improves with ambulation.  She denies swelling and posterior calf pain.  She tells me the flexeril helps her greatly with muslce spasm, particularly at night.  She wears CPAP.   She has no history of HTN and previous measures are normal in [facility].  She did not sleep well last night.     She has No Known Allergies.   She  has a past medical history of Allergy, Anxiety, Blood transfusion without reported diagnosis, Depression, and Hypertension.    She  reports that she has been smoking cigarettes.  She has been smoking about 0.50 packs per day. She has never used smokeless tobacco. She reports that she does not drink alcohol or use drugs. She  has no sexual activity history on file. The patient  has a past surgical history that includes Abdominal hysterectomy.  Her family history includes Diabetes in her mother; Hypothyroidism in her sister; Stroke in her father.  Review of Systems  Constitutional: Negative for chills, diaphoresis and fever.  Eyes: Negative.   Respiratory: Negative for shortness of breath.   Cardiovascular: Negative for chest pain, orthopnea and leg swelling.  Gastrointestinal: Negative for nausea.  Musculoskeletal: Positive for joint pain and myalgias. Negative for back pain, falls and neck pain.  Skin: Negative for rash.  Neurological: Negative for dizziness, sensory change, speech change, focal weakness and headaches.    The problem list and medications were reviewed and updated by myself where necessary and exist elsewhere in the encounter.   OBJECTIVE:  BP (!) 148/88 (BP Location: Left Arm, Patient Position: Sitting, Cuff Size: Normal)   Pulse 75   Temp (!) 97.5 F (36.4 C) (Oral)   Ht 4\' 11"  (1.499 m)   Wt 162 lb 9.6 oz  (73.8 kg)   SpO2 95%   BMI 32.84 kg/m   Wt Readings from Last 3 Encounters:  04/25/17 162 lb 9.6 oz (73.8 kg)  12/27/16 160 lb 8 oz (72.8 kg)  12/27/16 162 lb (73.5 kg)   Temp Readings from Last 3 Encounters:  04/25/17 (!) 97.5 F (36.4 C) (Oral)  12/27/16 98.3 F (36.8 C) (Oral)  11/30/16 98.4 F (36.9 C) (Oral)   BP Readings from Last 3 Encounters:  04/25/17 (!) 148/88  12/27/16 124/80  12/27/16 128/86   Pulse Readings from Last 3 Encounters:  04/25/17 75  12/27/16 88  12/27/16 86     Physical Exam  Constitutional: She is active.  Non-toxic appearance.  Cardiovascular: Normal rate, regular rhythm, S1 normal, S2 normal, normal heart sounds and intact distal pulses. Exam reveals no gallop, no friction rub and no decreased pulses.  No murmur heard. Pulmonary/Chest: Effort normal. No tachypnea. She has no rales.  Abdominal: She exhibits no distension.  Musculoskeletal: She exhibits no edema or deformity.       Right knee: She exhibits bony tenderness. She exhibits normal range of motion, no swelling, no effusion, no ecchymosis, no deformity, no laceration, no erythema, normal alignment, no LCL laxity, normal patellar mobility, normal meniscus and no MCL laxity. Tenderness found. Medial joint line and lateral joint line tenderness noted. No MCL, no LCL and no patellar tendon tenderness noted.  Neurological: She is alert.  Skin: Skin is warm and dry. She is not diaphoretic.  No pallor.    No results found for this or any previous visit (from the past 72 hour(s)).  No results found.  ASSESSMENT AND PLAN:  Janice Hamilton was seen today for right bursitis.  Diagnoses and all orders for this visit:  Chronic pain of right knee: OA likely given HPI.  Exam normal aside from joint line TTP which is mild. Advised tylneol.  -     DG Knee Complete 4 Views Right; Future  Prediabetes -     Hemoglobin A1c  Elevated BP without diagnosis of hypertension: RTC in two weeks.  -     Renal  Function Panel  Seasonal allergies -     cetirizine (ZYRTEC) 10 MG tablet; Take 1 tablet (10 mg total) by mouth daily.  Trapezius muscle spasm: Chronic.  Flexeril helps.  She is not having side effects.  CPAP compliant.  -     cyclobenzaprine (FLEXERIL) 10 MG tablet; Take 0.5-1 tablets (5-10 mg total) by mouth 3 (three) times daily as needed for muscle spasms. Do not mix with narcotics. May cause drowsiness.    The patient is advised to call or return to clinic if she does not see an improvement in symptoms, or to seek the care of the closest emergency department if she worsens with the above plan.   Philis Fendt, MHS, PA-C [facility] 04/25/2017 9:26 AM

## 2017-04-25 NOTE — Patient Instructions (Addendum)
Come back in two weeks for a recheck of your blood pressure.    Take tylenol 1000 mg every eight hours along with your flexeril for chronic muscle spasms.    IF you received an x-ray today, you will receive an invoice from [facility]. Please contact [facility] at [phone] with questions or concerns regarding your invoice.   IF you received labwork today, you will receive an invoice from [facility]. Please contact [facility] at [phone] with questions or concerns regarding your invoice.   Our billing staff will not be able to assist you with questions regarding bills from these companies.  You will be contacted with the lab results as soon as they are available. The fastest way to get your results is to activate your My Chart account. Instructions are located on the last page of this paperwork. If you have not heard from [place] regarding the results in 2 weeks, please contact this office.

## 2017-04-26 LAB — HEMOGLOBIN A1C
ESTIMATED AVERAGE GLUCOSE: 131 mg/dL
HEMOGLOBIN A1C: 6.2 % — AB (ref 4.8–5.6)

## 2017-04-26 LAB — RENAL FUNCTION PANEL
ALBUMIN: 4.8 g/dL (ref 3.6–4.8)
BUN / CREAT RATIO: 15 (ref 12–28)
BUN: 15 mg/dL (ref 8–27)
CO2: 22 mmol/L (ref 20–29)
Calcium: 10.4 mg/dL — ABNORMAL HIGH (ref 8.7–10.3)
Chloride: 101 mmol/L (ref 96–106)
Creatinine, Ser: 1.02 mg/dL — ABNORMAL HIGH (ref 0.57–1.00)
GFR, EST AFRICAN AMERICAN: 66 mL/min/{1.73_m2} (ref >59)
GFR, EST NON AFRICAN AMERICAN: 57 mL/min/{1.73_m2} — AB (ref >59)
GLUCOSE: 93 mg/dL (ref 65–99)
POTASSIUM: 4.8 mmol/L (ref 3.5–5.2)
Phosphorus: 3.7 mg/dL (ref 2.5–4.5)
Sodium: 141 mmol/L (ref 134–144)

## 2017-05-07 ENCOUNTER — Ambulatory Visit (INDEPENDENT_AMBULATORY_CARE_PROVIDER_SITE_OTHER): Payer: Medicare Other | Admitting: Physician Assistant

## 2017-05-07 ENCOUNTER — Other Ambulatory Visit: Payer: Self-pay

## 2017-05-07 ENCOUNTER — Encounter: Payer: Self-pay | Admitting: Physician Assistant

## 2017-05-07 VITALS — BP 124/78 | HR 99 | Temp 98.4°F | Resp 18 | Ht 59.0 in | Wt 162.6 lb

## 2017-05-07 DIAGNOSIS — K219 Gastro-esophageal reflux disease without esophagitis: Secondary | ICD-10-CM

## 2017-05-07 DIAGNOSIS — F410 Panic disorder [episodic paroxysmal anxiety] without agoraphobia: Secondary | ICD-10-CM | POA: Diagnosis not present

## 2017-05-07 DIAGNOSIS — F411 Generalized anxiety disorder: Secondary | ICD-10-CM | POA: Diagnosis not present

## 2017-05-07 MED ORDER — PANTOPRAZOLE SODIUM 40 MG PO TBEC: 40.0000 mg | DELAYED_RELEASE_TABLET | Freq: Every day | ORAL | 3 refills | Status: DC

## 2017-05-07 MED ORDER — CITALOPRAM HYDROBROMIDE 10 MG PO TABS: 10.0000 mg | ORAL_TABLET | Freq: Every day | ORAL | 0 refills | Status: DC

## 2017-05-07 NOTE — Patient Instructions (Addendum)
USP certified Vitamin D/calcium supplement per pharmacist.   Exercise improves every system in the body.  It lowers the risk of heart disease, decreases blood pressure, reduces the symptoms of depression and anxiety, and lowers blood sugar. To receive these benefits, try to get 150 minutes of planned exercise each week.  You can break this 150 minutes up however you like.  For instance, you can perform 30 minutes of brisk walking 5 days a week, or perform 50 minutes 3 days a week.  If you don't like walking, or can't find a safe place to walk, find another way to move that you can enjoy.  Exercise tapes, cycling, stair climbing, swimming, or a combination will be just as good as a walking program. To ensure the proper intensity, you can use the talk test. Essentially, you should be able to carry on a conversation, but you should have to take short breaks from the conversation in order catch your breath.  Start the panic medication today.     IF you received an x-ray today, you will receive an invoice from [facility]. Please contact [facility] at [phone] with questions or concerns regarding your invoice.   IF you received labwork today, you will receive an invoice from [facility]. Please contact [facility] at [phone] with questions or concerns regarding your invoice.   Our billing staff will not be able to assist you with questions regarding bills from these companies.  You will be contacted with the lab results as soon as they are available. The fastest way to get your results is to activate your My Chart account. Instructions are located on the last page of this paperwork. If you have not heard from [place] regarding the results in 2 weeks, please contact this office.

## 2017-05-07 NOTE — Progress Notes (Signed)
05/07/2017 9:06 AM   DOB: [birth year] / MRN: [id number]  SUBJECTIVE:  Janice Hamilton is a 67 y.o. female presenting for recheck blood pressure.  Patient was elevated the last time she was here and advised that she return in 2 weeks for recheck.  She tells me " I was having a panic attack the last time he was here."  She states she gets panic roughly daily.  Triggers include driving on the interstate, driving in the rain, going to new places, meeting new people.  Reports that she will have sweats, her heart rate seems to speed up, she feels flush.  She is requesting PPI medications today.  She is taking these in the past for episodes of GERD which has been worse lately.  She has been taking her partners pantoprazole because she was out of omeprazole and the OTC Prilosec is too expensive for her to purchase monthly.  She denies dysphagia, odynophagia, food getting stuck.  Eating makes his symptoms worse.  She has No Known Allergies.   She  has a past medical history of Allergy, Anxiety, Blood transfusion without reported diagnosis, Depression, and Hypertension.    She  reports that she has been smoking cigarettes.  She has been smoking about 0.50 packs per day. She has never used smokeless tobacco. She reports that she does not drink alcohol or use drugs. She  has no sexual activity history on file. The patient  has a past surgical history that includes Abdominal hysterectomy.  Her family history includes Diabetes in her mother; Hypothyroidism in her sister; Stroke in her father.  Review of Systems  Constitutional: Negative for chills, diaphoresis and fever.  Eyes: Negative.   Respiratory: Negative for cough, hemoptysis, sputum production, shortness of breath and wheezing.   Cardiovascular: Negative for chest pain, orthopnea and leg swelling.  Gastrointestinal: Negative for abdominal pain, blood in stool, constipation, diarrhea, heartburn, melena, nausea and vomiting.  Genitourinary: Negative  for dysuria, flank pain, frequency, hematuria and urgency.  Skin: Negative for rash.  Neurological: Negative for dizziness, sensory change, speech change, focal weakness and headaches.  Psychiatric/Behavioral: Negative for depression, hallucinations, memory loss, substance abuse and suicidal ideas. The patient is nervous/anxious. The patient does not have insomnia.     The problem list and medications were reviewed and updated by myself where necessary and exist elsewhere in the encounter.   OBJECTIVE:  BP 124/78   Pulse 99   Temp 98.4 F (36.9 C) (Oral)   Resp 18   Ht 4\' 11"  (1.499 m)   Wt 162 lb 9.6 oz (73.8 kg)   SpO2 95%   BMI 32.84 kg/m   Physical Exam  Constitutional: She is oriented to person, place, and time. She appears well-nourished. No distress.  Eyes: Pupils are equal, round, and reactive to light. EOM are normal.  Cardiovascular: Normal rate.  Pulmonary/Chest: Effort normal.  Abdominal: She exhibits no distension.  Neurological: She is alert and oriented to person, place, and time. No cranial nerve deficit. Gait normal.  Skin: Skin is dry. She is not diaphoretic.  Psychiatric: She has a normal mood and affect. Her behavior is normal. Judgment and thought content normal.  Vitals reviewed.   No results found for this or any previous visit (from the past 72 hour(s)).  No results found.  ASSESSMENT AND PLAN:  Brennyn was seen today for hypertension.  Diagnoses and all orders for this visit:  Gastroesophageal reflux disease without esophagitis -     pantoprazole (PROTONIX) 40  MG tablet; Take 1 tablet (40 mg total) by mouth daily.  Generalized anxiety disorder with panic attacks -     citalopram (CELEXA) 10 MG tablet; Take 1 tablet (10 mg total) by mouth daily. -     Care order/instruction:    The patient is advised to call or return to clinic if she does not see an improvement in symptoms, or to seek the care of the closest emergency department if she  worsens with the above plan.   Philis Fendt, MHS, PA-C [facility] 05/07/2017 9:06 AM

## 2017-08-08 ENCOUNTER — Ambulatory Visit (INDEPENDENT_AMBULATORY_CARE_PROVIDER_SITE_OTHER): Payer: Medicare Other | Admitting: Physician Assistant

## 2017-08-08 ENCOUNTER — Other Ambulatory Visit: Payer: Self-pay

## 2017-08-08 ENCOUNTER — Encounter: Payer: Self-pay | Admitting: Physician Assistant

## 2017-08-08 VITALS — BP 138/92 | HR 98 | Temp 98.4°F | Resp 16 | Ht 60.0 in | Wt 163.4 lb

## 2017-08-08 DIAGNOSIS — E785 Hyperlipidemia, unspecified: Secondary | ICD-10-CM

## 2017-08-08 DIAGNOSIS — K219 Gastro-esophageal reflux disease without esophagitis: Secondary | ICD-10-CM

## 2017-08-08 DIAGNOSIS — Z13220 Encounter for screening for lipoid disorders: Secondary | ICD-10-CM | POA: Diagnosis not present

## 2017-08-08 DIAGNOSIS — F410 Panic disorder [episodic paroxysmal anxiety] without agoraphobia: Secondary | ICD-10-CM | POA: Diagnosis not present

## 2017-08-08 DIAGNOSIS — Z23 Encounter for immunization: Secondary | ICD-10-CM | POA: Diagnosis not present

## 2017-08-08 DIAGNOSIS — F411 Generalized anxiety disorder: Secondary | ICD-10-CM | POA: Diagnosis not present

## 2017-08-08 DIAGNOSIS — Z1329 Encounter for screening for other suspected endocrine disorder: Secondary | ICD-10-CM

## 2017-08-08 DIAGNOSIS — M62838 Other muscle spasm: Secondary | ICD-10-CM

## 2017-08-08 DIAGNOSIS — Z1211 Encounter for screening for malignant neoplasm of colon: Secondary | ICD-10-CM | POA: Diagnosis not present

## 2017-08-08 DIAGNOSIS — R030 Elevated blood-pressure reading, without diagnosis of hypertension: Secondary | ICD-10-CM

## 2017-08-08 MED ORDER — CYCLOBENZAPRINE HCL 10 MG PO TABS: 5.0000 mg | ORAL_TABLET | Freq: Three times a day (TID) | ORAL | 3 refills | Status: AC | PRN

## 2017-08-08 MED ORDER — LORAZEPAM 0.5 MG PO TABS
0.2500 mg | ORAL_TABLET | Freq: Every day | ORAL | 1 refills | Status: AC | PRN
Start: 2017-08-08 — End: ?

## 2017-08-08 MED ORDER — CITALOPRAM HYDROBROMIDE 20 MG PO TABS: 20.0000 mg | ORAL_TABLET | Freq: Every day | ORAL | 3 refills | Status: DC

## 2017-08-08 MED ORDER — PANTOPRAZOLE SODIUM 40 MG PO TBEC: 40.0000 mg | DELAYED_RELEASE_TABLET | Freq: Every day | ORAL | 3 refills | Status: DC

## 2017-08-08 NOTE — Progress Notes (Signed)
08/08/2017 11:03 AM   DOB: 1950-08-22 / MRN: [id number]  SUBJECTIVE:  Janice Hamilton is a 67 y.o. female presenting for recheck anxiety and panic. No improvement with citalopram. Symptoms present for chronically.  The problem is no better.   No GERD with pantoprazole.   FLexeril helps with various muscle pain daily. Previous sed rate normal.   Prehypertension possibly related to stress of driving.   Has several health maintenance items that need fulfilling today.     She has No Known Allergies.   She  has a past medical history of Allergy, Anxiety, Blood transfusion without reported diagnosis, Depression, and Hypertension.    She  reports that she has been smoking cigarettes.  She has been smoking about 0.50 packs per day. She has never used smokeless tobacco. She reports that she does not drink alcohol or use drugs. She  has no sexual activity history on file. The patient  has a past surgical history that includes Abdominal hysterectomy.  Her family history includes Diabetes in her mother; Hypothyroidism in her sister; Stroke in her father.  Review of Systems  Constitutional: Negative for chills, diaphoresis and fever.  Eyes: Negative.   Respiratory: Negative for cough, hemoptysis, sputum production, shortness of breath and wheezing.   Cardiovascular: Negative for chest pain, orthopnea and leg swelling.  Gastrointestinal: Negative for abdominal pain, blood in stool, constipation, diarrhea, heartburn, melena, nausea and vomiting.  Genitourinary: Negative for dysuria, flank pain, frequency, hematuria and urgency.  Musculoskeletal: Positive for myalgias.  Skin: Negative for rash.  Neurological: Negative for dizziness, sensory change, speech change, focal weakness and headaches.  Psychiatric/Behavioral: The patient is nervous/anxious.     The problem list and medications were reviewed and updated by myself where necessary and exist elsewhere in the encounter.   OBJECTIVE:  BP  (!) 138/92 (BP Location: Right Arm, Patient Position: Sitting, Cuff Size: Normal)   Pulse 98   Temp 98.4 F (36.9 C)   Resp 16   Ht 5' (1.524 m)   Wt 163 lb 6.4 oz (74.1 kg)   SpO2 95%   BMI 31.91 kg/m   Wt Readings from Last 3 Encounters:  08/08/17 163 lb 6.4 oz (74.1 kg)  05/07/17 162 lb 9.6 oz (73.8 kg)  04/25/17 162 lb 9.6 oz (73.8 kg)   Temp Readings from Last 3 Encounters:  08/08/17 98.4 F (36.9 C)  05/07/17 98.4 F (36.9 C) (Oral)  04/25/17 (!) 97.5 F (36.4 C) (Oral)   BP Readings from Last 3 Encounters:  08/08/17 (!) 138/92  05/07/17 124/78  04/25/17 (!) 148/88   Pulse Readings from Last 3 Encounters:  08/08/17 98  05/07/17 99  04/25/17 75    Physical Exam  Constitutional: She is oriented to person, place, and time. She appears well-nourished.  Non-toxic appearance. No distress.  Eyes: Pupils are equal, round, and reactive to light. EOM are normal.  Cardiovascular: Normal rate, regular rhythm, S1 normal, S2 normal, normal heart sounds and intact distal pulses. Exam reveals no gallop, no friction rub and no decreased pulses.  No murmur heard. Pulmonary/Chest: Effort normal. No stridor. No respiratory distress. She has no wheezes. She has no rales.  Abdominal: She exhibits no distension.  Musculoskeletal: She exhibits no edema.  Neurological: She is alert and oriented to person, place, and time. No cranial nerve deficit. Gait normal.  Skin: Skin is warm and dry. She is not diaphoretic. No pallor.  Psychiatric: She has a normal mood and affect.  Vitals reviewed.  Lab Results  Component Value Date   HGBA1C 6.2 (H) 04/25/2017    Lab Results  Component Value Date   CREATININE 1.02 (H) 04/25/2017   BUN 15 04/25/2017   NA 141 04/25/2017   K 4.8 04/25/2017   CL 101 04/25/2017   CO2 22 04/25/2017   ASSESSMENT AND PLAN:  Mikyla was seen today for med check and hypertension.  Diagnoses and all orders for this visit:  Prehypertension Comments: Will  continue to monitor.   Muscle spasm Comments: Chronic.  Controlled on flexeril.  Orders: -     cyclobenzaprine (FLEXERIL) 10 MG tablet; Take 0.5-1 tablets (5-10 mg total) by mouth 3 (three) times daily as needed for muscle spasms. Do not mix with narcotics. May cause drowsiness.  Generalized anxiety disorder with panic attacks Comments: Increasing to 20 Citalopram.  Orders: -     citalopram (CELEXA) 20 MG tablet; Take 1 tablet (20 mg total) by mouth daily. -     LORazepam (ATIVAN) 0.5 MG tablet; Take 0.5-1 tablets (0.25-0.5 mg total) by mouth daily as needed for anxiety.  Gastroesophageal reflux disease without esophagitis Comments: Controlled with pantop qam before meal.  Orders: -     pantoprazole (PROTONIX) 40 MG tablet; Take 1 tablet (40 mg total) by mouth daily.  Screening for thyroid disorder -     TSH  Dyslipidemia -     Lipid Panel  Special screening for malignant neoplasms, colon -     Cologuard  Need for prophylactic vaccination against Streptococcus pneumoniae (pneumococcus) -     Pneumococcal polysaccharide vaccine 23-valent greater than or equal to 2yo subcutaneous/IM    The patient is advised to call or return to clinic if she does not see an improvement in symptoms, or to seek the care of the closest emergency department if she worsens with the above plan.   Philis Fendt, MHS, PA-C [facility] 08/08/2017 11:03 AM

## 2017-08-08 NOTE — Patient Instructions (Addendum)
IF you received an x-ray today, you will receive an invoice from [facility]. Please contact [facility] at [phone] with questions or concerns regarding your invoice.   IF you received labwork today, you will receive an invoice from [facility]. Please contact [facility] at [phone] with questions or concerns regarding your invoice.   Our billing staff will not be able to assist you with questions regarding bills from these companies.  You will be contacted with the lab results as soon as they are available. The fastest way to get your results is to activate your My Chart account. Instructions are located on the last page of this paperwork. If you have not heard from [place] regarding the results in 2 weeks, please contact this office.    Pneumococcal Polysaccharide Vaccine: What You Need to Know 1. Why get vaccinated? Vaccination can protect older adults (and some children and younger adults) from pneumococcal disease. Pneumococcal disease is caused by bacteria that can spread from person to person through close contact. It can cause ear infections, and it can also lead to more serious infections of the:  Lungs (pneumonia),  Blood (bacteremia), and  Covering of the brain and spinal cord (meningitis). Meningitis can cause deafness and brain damage, and it can be fatal.  Anyone can get pneumococcal disease, but children under 83 years of age, people with certain medical conditions, adults over 61 years of age, and cigarette smokers are at the highest risk. About 18,000 older adults die each year from pneumococcal disease in the [place]. Treatment of pneumococcal infections with penicillin and other drugs used to be more effective. But some strains of the disease have become resistant to these drugs. This makes prevention of the disease, through vaccination, even more important. 2. Pneumococcal polysaccharide vaccine (PPSV23) Pneumococcal polysaccharide vaccine  (PPSV23) protects against 23 types of pneumococcal bacteria. It will not prevent all pneumococcal disease. PPSV23 is recommended for:  All adults 13 years of age and older,  Anyone 2 through 67 years of age with certain long-term health problems,  Anyone 2 through 68 years of age with a weakened immune system,  Adults 47 through 67 years of age who smoke cigarettes or have asthma.  Most people need only one dose of PPSV. A second dose is recommended for certain high-risk groups. People 51 and older should get a dose even if they have gotten one or more doses of the vaccine before they turned 65. Your healthcare provider can give you more information about these recommendations. Most healthy adults develop protection within 2 to 3 weeks of getting the shot. 3. Some people should not get this vaccine  Anyone who has had a life-threatening allergic reaction to PPSV should not get another dose.  Anyone who has a severe allergy to any component of PPSV should not receive it. Tell your provider if you have any severe allergies.  Anyone who is moderately or severely ill when the shot is scheduled may be asked to wait until they recover before getting the vaccine. Someone with a mild illness can usually be vaccinated.  Children less than 54 years of age should not receive this vaccine.  There is no evidence that PPSV is harmful to either a pregnant woman or to her fetus. However, as a precaution, women who need the vaccine should be vaccinated before becoming pregnant, if possible. 4. Risks of a vaccine reaction With any medicine, including vaccines, there is a chance of side effects. These are usually mild  and go away on their own, but serious reactions are also possible. About half of people who get PPSV have mild side effects, such as redness or pain where the shot is given, which go away within about two days. Less than 1 out of 100 people develop a fever, muscle aches, or more severe local  reactions. Problems that could happen after any vaccine:  People sometimes faint after a medical procedure, including vaccination. Sitting or lying down for about 15 minutes can help prevent fainting, and injuries caused by a fall. Tell your doctor if you feel dizzy, or have vision changes or ringing in the ears.  Some people get severe pain in the shoulder and have difficulty moving the arm where a shot was given. This happens very rarely.  Any medication can cause a severe allergic reaction. Such reactions from a vaccine are very rare, estimated at about 1 in a million doses, and would happen within a few minutes to a few hours after the vaccination. As with any medicine, there is a very remote chance of a vaccine causing a serious injury or death. The safety of vaccines is always being monitored. For more information, visit: http://www.aguilar.org/ 5. What if there is a serious reaction? What should I look for? Look for anything that concerns you, such as signs of a severe allergic reaction, very high fever, or unusual behavior. Signs of a severe allergic reaction can include hives, swelling of the face and throat, difficulty breathing, a fast heartbeat, dizziness, and weakness. These would usually start a few minutes to a few hours after the vaccination. What should I do? If you think it is a severe allergic reaction or other emergency that can't wait, call 9-1-1 or get to the nearest hospital. Otherwise, call your doctor. Afterward, the reaction should be reported to the Vaccine Adverse Event Reporting System (VAERS). Your doctor might file this report, or you can do it yourself through the VAERS web site at www.vaers.SamedayNews.es, or by calling (601) 143-3334. VAERS does not give medical advice. 6. How can I learn more?  Ask your doctor. He or she can give you the vaccine package insert or suggest other sources of information.  Call your local or state health department.  Contact the  Centers for Disease Control and Prevention (CDC): ? Call (320)552-2880 (1-800-CDC-INFO) or ? Visit CDC's website at http://hunter.com/ CDC Pneumococcal Polysaccharide Vaccine VIS (05/14/13) This information is not intended to replace advice given to you by your health care provider. Make sure you discuss any questions you have with your health care provider. Document Released: 11/04/2005 Document Revised: 09/28/2015 Document Reviewed: 09/28/2015 Elsevier Interactive Patient Education  2017 [employer].

## 2017-08-09 LAB — LIPID PANEL
CHOLESTEROL TOTAL: 265 mg/dL — AB (ref 100–199)
Chol/HDL Ratio: 6.8 ratio — ABNORMAL HIGH (ref 0.0–4.4)
HDL: 39 mg/dL — ABNORMAL LOW (ref >39)
LDL CALC: 179 mg/dL — AB (ref 0–99)
Triglycerides: 236 mg/dL — ABNORMAL HIGH (ref 0–149)
VLDL CHOLESTEROL CAL: 47 mg/dL — AB (ref 5–40)

## 2017-08-09 LAB — TSH: TSH: 2.32 u[IU]/mL (ref 0.450–4.500)

## 2017-08-18 ENCOUNTER — Other Ambulatory Visit: Payer: Self-pay | Admitting: Physician Assistant

## 2017-08-18 DIAGNOSIS — E785 Hyperlipidemia, unspecified: Secondary | ICD-10-CM

## 2017-08-18 DIAGNOSIS — J302 Other seasonal allergic rhinitis: Secondary | ICD-10-CM

## 2017-08-18 MED ORDER — ROSUVASTATIN CALCIUM 5 MG PO TABS: 5.0000 mg | ORAL_TABLET | Freq: Every day | ORAL | 3 refills | Status: DC

## 2017-08-19 ENCOUNTER — Telehealth: Payer: Self-pay | Admitting: *Deleted

## 2017-08-19 NOTE — Telephone Encounter (Signed)
Left message in home voice mail, Philis Fendt (physicians assistant) wants you to take a low dose of cholesterol medication. The medication is Crestor 5 mg take 1 tablet by mouth daily, sent to [facility] in [place].

## 2017-08-26 DIAGNOSIS — Z1211 Encounter for screening for malignant neoplasm of colon: Secondary | ICD-10-CM | POA: Diagnosis not present

## 2017-09-05 ENCOUNTER — Telehealth: Payer: Self-pay | Admitting: Physician Assistant

## 2017-09-05 NOTE — Telephone Encounter (Signed)
Copied from [place] [phone]. Topic: Quick Communication - See Telephone Encounter >> Sep 05, 2017  1:15 PM Margot Ables wrote: CRM for notification. See Telephone encounter for: 09/05/17. Calling to notify of abnormal cologuard results that were faxed to Philis Fendt, PA at [phone] early morning 09/05/17. Please call if not received. Follow prompts for provider support.

## 2017-09-08 LAB — COLOGUARD: COLOGUARD: POSITIVE

## 2017-09-09 ENCOUNTER — Other Ambulatory Visit: Payer: Self-pay | Admitting: Physician Assistant

## 2017-09-09 DIAGNOSIS — R195 Other fecal abnormalities: Secondary | ICD-10-CM

## 2017-09-09 NOTE — Telephone Encounter (Signed)
Patient needs colonoscopy given positive cologaurd. Referral to [facility] in.  Philis Fendt, MS, PA-C 8:13 AM, 09/09/2017

## 2017-10-27 ENCOUNTER — Encounter: Payer: Self-pay | Admitting: Neurology

## 2017-10-27 ENCOUNTER — Ambulatory Visit (INDEPENDENT_AMBULATORY_CARE_PROVIDER_SITE_OTHER): Payer: Medicare Other | Admitting: Neurology

## 2017-10-27 VITALS — BP 143/94 | HR 88 | Ht 60.0 in | Wt 165.0 lb

## 2017-10-27 DIAGNOSIS — Z9989 Dependence on other enabling machines and devices: Secondary | ICD-10-CM

## 2017-10-27 DIAGNOSIS — F17219 Nicotine dependence, cigarettes, with unspecified nicotine-induced disorders: Secondary | ICD-10-CM

## 2017-10-27 DIAGNOSIS — G4733 Obstructive sleep apnea (adult) (pediatric): Secondary | ICD-10-CM

## 2017-10-27 NOTE — Progress Notes (Signed)
[facility]   Provider:  Larey Seat, M D  Primary Care Physician:  Janice Hamilton   Referring Provider: Tereasa Coop, PA-C   Chief Complaint  Patient presents with  . Follow-up    Room 11. Pt is alone. She reports that she has been doing well.     HPI:  Janice Hamilton is a 67 y.o. female caucasian Patient, seen here on 10-27-2017 for a CPAP compliance visit.  I have the pleasure to meet with Janice Hamilton today, who has remained a very highly compliant CPAP user.  The patient has 100% use at compliance 4 days, and 97% compliance 4 hours.  He averages 6 hours and 35 minutes each night.  Her residual AHI is 1.1/h, in spite of rather high air leaks there is excellent control of her apnea.  Set pressure is 11 cmH2O with 3 cm EPR, her medication list still includes Celexa, Flexeril, Ativan and Zyrtec.  Her fatigue severity score was endorsed at 18 points and her Epworth sleepiness score was endorsed at 10 points out of 24.  She endorsed only 2 points out of 15 and the geriatric depression score. She continues to smoke. She has failed to quit - smokes 15  cig. a day.   I have the pleasure of seeing Mrs. Janice Hamilton today on 10/21/2016 in a revisit, the patient was tested in a split-night polysomnography on 06/21/2018Carmen Krisa Hamilton, M.D. the sleep study confirmed a very severe form of sleep apnea, her AHI was 60 per hour-this equals 1 apnea or shallow breathing spells per minute of sleep, her RDI was 91.5-indicating loud snoring and upper airway resistance. Desaturation was borderline the patient spent only 25 minutes of sleep time at or below 88%, she did not have PLMS, her heart rate was in sinus rhythm, remarkable was that she took 3 bathroom breaks within 2 hours. Once CPAP was initiated, the patient's nocturia resolved, she was titrated to 11 cm water and was given a small size fullface mask by the technologist. EPR of 2 cm was added, her sleep became sustained and  refreshing. In the morning after the titration the patient already indicated that her sleep was much better than usual. I have also the opportunity to review her CPAP compliance today she's 100% compliant with 7 hours and 6 minutes of nightly use, AHI is 0.4, using a full face mask. Fatigue severity scale was reduced to 25 and Epworth sleepiness score to 7 points from previously 17.  The patient reports that she sleeps so much better, feels so much more alert and asleep a sound and no longer interrupted by bathroom breaks as frequently.  She has just started an exercise regimen because now she feels the energy needed to take those steps. She hopes to lose some weight..         Chief complaint according to patient :" I snore so bad- so embarrassing "  05-2016 consult with CD Janice Hamilton is a 11 -year-old Caucasian right-handed female who has a history of chronic leg pain, hip bursitis, cramping, excessive daytime fatigue, respiratory allergies, depression and hypertension. The patient had a surgical abdominal hysterectomy. Over the last 3 years or so her snoring has evolved into a very loud, thunderous nocturnal breathing and her partner for 27 years is no longer sleeping in the same room. Her partner has multiple times told her that she stops to breath-  very upsetting to him. Only recently was she advised that her grandson (  7)  to stay awake for just 5 minutes and not snore so he can sleep.  Sleep habits are as follows: Janice Hamilton reports that she falls asleep every evening while watching TV. She falls asleep whenever she is not physically active on mentally stimulated. She often feels not tired but the sleep overwhelms her. She spends the morning hours at her daughter's house between 9 and 12 and takes care of her dog. She falls asleep or struggles with sleepiness driving to her daughter's home and back, mostly on the way back. She takes a nap every day afternoon after lunch, duration up to 2  hours, and  by 9 PM  she is asleep no matter what she is watching on TV. Ray, her partner, will wake her up between 42 and 11 usually when her snoring in the living room has started to bother him, and she will go to her bedroom. She has no trouble continuing to sleep there, she wakes up about every 2 hours or so sometimes has to go to the bathroom, sometimes she just wakes up perhaps because of snoring or apnea. She rises in the morning at 6 AM and feels exhausted.  Sleep medical history and family sleep history: no sleep walking or bedwetting history, OSA suspected to be present for the last 27 years.   Her parents passed away when she was young and she does not recall any sleep problems affecting them, she has a sister who is healthy.  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  lives with her partner of 27 years , Ray . Ray is 23 years younger . Adult child daughter , now 66. She  is an active smoker and does not drink alcohol, she drinks cog fee in AM, iced tea throughout the day , 2-3 , no sodas.   Janice Hamilton has very high risk factors for obstructive sleep apnea and she herself is pretty sure that she have it. She is a loud snorer, the amplitude of snoring has increased continuously over the last years and her partner has witnessed her to stop breathing. She has become extremely fatigued in daytime with the fatigue severity score 49 and very sleepy with an Epworth score of 17. Based on the significant impairment in her daily functioning due to not getting good sleep I would like her to have a split-night polysomnography as soon as possible. I would like to marks the study is urgent. She wakes up with a dry mouth but not with headaches, and I do not think we need capnography to be included in the setting.   Review of Systems: Out of a complete 14 system review, the patient complains of only the following symptoms, and all other reviewed systems are negative.  Her fatigue  severity score was endorsed at 18 points and her Epworth sleepiness score was endorsed at 10 points out of 24.  She endorsed only 2 points out of 15 and the geriatric depression score. 5-12- 2018 :Epworth score 17 points , Fatigue severity score 49 points , depression score n/a . 10-21-2016, on PAP: Epworth 5, FSS 25, geriatric depression score 1/15    Social History   Socioeconomic History  . Marital status: Divorced    Spouse name: Not on file  . Number of children: 1  . Years of education: Not on file  . Highest education level: High school graduate  Occupational History  . Not on file  Social Needs  . Emergency planning/management officer  strain: Not hard at all  . Food insecurity:    Worry: Never true    Inability: Never true  . Transportation needs:    Medical: No    Non-medical: No  Tobacco Use  . Smoking status: Current Every Day Smoker    Packs/day: 0.50    Types: Cigarettes  . Smokeless tobacco: Never Used  Substance and Sexual Activity  . Alcohol use: No  . Drug use: No  . Sexual activity: Not on file  Lifestyle  . Physical activity:    Days per week: 0 days    Minutes per session: 0 min  . Stress: Not at all  Relationships  . Social connections:    Talks on phone: More than three times a week    Gets together: More than three times a week    Attends religious service: Never    Active member of club or organization: No    Attends meetings of clubs or organizations: Never    Relationship status: Divorced  . Intimate partner violence:    Fear of current or ex partner: No    Emotionally abused: No    Physically abused: No    Forced sexual activity: No  Other Topics Concern  . Not on file  Social History Narrative  . Not on file    Family History  Problem Relation Age of Onset  . Diabetes Mother   . Stroke Father   . Hypothyroidism Sister     Past Medical History:  Diagnosis Date  . Allergy   . Anxiety   . Blood transfusion without reported diagnosis   .  Depression   . Hypertension     Past Surgical History:  Procedure Laterality Date  . ABDOMINAL HYSTERECTOMY      Current Outpatient Medications  Medication Sig Dispense Refill  . cetirizine (ZYRTEC) 10 MG tablet Take 1 tablet (10 mg total) by mouth daily. 90 tablet 3  . citalopram (CELEXA) 20 MG tablet Take 1 tablet (20 mg total) by mouth daily. 90 tablet 3  . cyclobenzaprine (FLEXERIL) 10 MG tablet Take 0.5-1 tablets (5-10 mg total) by mouth 3 (three) times daily as needed for muscle spasms. Do not mix with narcotics. May cause drowsiness. 270 tablet 3  . LORazepam (ATIVAN) 0.5 MG tablet Take 0.5-1 tablets (0.25-0.5 mg total) by mouth daily as needed for anxiety. 30 tablet 1  . pantoprazole (PROTONIX) 40 MG tablet Take 1 tablet (40 mg total) by mouth daily. 90 tablet 3  . rosuvastatin (CRESTOR) 5 MG tablet Take 1 tablet (5 mg total) by mouth daily. 90 tablet 3   No current facility-administered medications for this visit.     Allergies as of 10/27/2017  . (No Known Allergies)    Vitals: BP (!) 143/94   Pulse 88   Ht 5' (1.524 m)   Wt 165 lb (74.8 kg)   BMI 32.22 kg/m  Last Weight:  Wt Readings from Last 1 Encounters:  10/27/17 165 lb (74.8 kg)   LGX:QJJH mass index is 32.22 kg/m.     Last Height:   Ht Readings from Last 1 Encounters:  10/27/17 5' (1.524 m)    Physical exam:  General: The patient is awake, alert and appears not in acute distress. The patient is well groomed. Head: Normocephalic, atraumatic. Neck is supple. Mallampati 5 - extremely small upper airway.  neck circumference: 17.00 Nasal airflow congestion, TMJ click is not evident. Retrognathia is seen.  Tooth grinding marks- bruxism.  Cardiovascular:  Regular rate and rhythm. Respiratory:  There is wheezing and rhonci- patient is very congested but feels still that CPAP is comfortable to use.  Skin:  Without evidence of edema, or rash.  Trunk: BMI is 33. The patient's posture is erect .  Neurologic  exam : The patient is awake and alert, oriented to place and time.    Speech is fluent,  with dysphonia, she is very hoarse.  Mood and affect are appropriate.  Cranial nerves:  Chronic nasal congestion, smell is affected, not taste - Pupils are equal and briskly reactive to light.  Extraocular movements  in vertical and horizontal planes intact and without nystagmus. Visual fields by finger perimetry are intact. Hearing to finger rub impaired. Facial sensation intact to fine touch.Facial motor strength is symmetric and tongue and uvula move midline. Shoulder shrug was symmetrical.  Sensory:  unchanged Fine touch, pinprick and vibration were tested in all extremities. Proprioception tested in the upper extremities was normal. Motor exam: unchanged Normal tone, muscle bulk and symmetric strength in all extremities.Patient walks without assistive device - Strength within normal limits. Stance is stable and normal. Deep tendon reflexes: in the  upper and lower extremities are symmetric and intact.   Assessment:  After physical and neurologic examination, review of laboratory studies,  Personal review of polysomnography and / or neurophysiology testing and pre-existing records as far as provided in visit., my assessment is;  1)  Hypersomnia resolved - was clearly a result of severe OSA ( AHI was 60/hr) and UARS -RDI was over 90/h . Had  extreme daytime sleepiness at  Epworth 17 , and fatigue in MAY 2018 visit-  Now much lower ! benefits form PAP therapy. She loves using CPAP ! No more nocturia, successfully controlling witnessed snoring and apnea. SPLIT study from 07-11-2016 reviewed.   2) Obesity - has no exercise or dietary rules.   3) Weight loss discussion- needs to speak to PCP- Philis Fendt, [facility] at [facility].    4) Smoking cessation, I offered a referral- she declined.   The patient was advised of the nature of the diagnosed disorder , the treatment options and the  risks for general health and  wellness arising from not treating the condition.   I spent more than 20 minutes of face to face time with the patient.  Greater than 50% of time was spent in counseling and coordination of care.  We have discussed the diagnosis and differential and I answered the patient's questions.    Plan:  Treatment plan and additional workup :  Continue using your auto pap capable CPAP at 12 with 3 cm EPR ! You are doing well.    RV in 12 month with me or NP.   Larey Seat, MD 75/06/4330, 9:51 AM  Certified in Neurology by ABPN Certified in [place] by [facility] [address] [place], [place] [address]

## 2017-10-27 NOTE — Patient Instructions (Signed)
Coping with Quitting Smoking Quitting smoking is a physical and mental challenge. You will face cravings, withdrawal symptoms, and temptation. Before quitting, work with your health care provider to make a plan that can help you cope. Preparation can help you quit and keep you from giving in. How can I cope with cravings? Cravings usually last for 5-10 minutes. If you get through it, the craving will pass. Consider taking the following actions to help you cope with cravings:  Keep your mouth busy: ? Chew sugar-free gum. ? Suck on hard candies or a straw. ? Brush your teeth.  Keep your hands and body busy: ? Immediately change to a different activity when you feel a craving. ? Squeeze or play with a ball. ? Do an activity or a hobby, like making bead jewelry, practicing needlepoint, or working with wood. ? Mix up your normal routine. ? Take a short exercise break. Go for a quick walk or run up and down stairs. ? Spend time in public places where smoking is not allowed.  Focus on doing something kind or helpful for someone else.  Call a friend or family member to talk during a craving.  Join a support group.  Call a quit line, such as 1-800-QUIT-NOW.  Talk with your health care provider about medicines that might help you cope with cravings and make quitting easier for you.  How can I deal with withdrawal symptoms? Your body may experience negative effects as it tries to get used to not having nicotine in the system. These effects are called withdrawal symptoms. They may include:  Feeling hungrier than normal.  Trouble concentrating.  Irritability.  Trouble sleeping.  Feeling depressed.  Restlessness and agitation.  Craving a cigarette.  To manage withdrawal symptoms:  Avoid places, people, and activities that trigger your cravings.  Remember why you want to quit.  Get plenty of sleep.  Avoid coffee and other caffeinated drinks. These may worsen some of your  symptoms.  How can I handle social situations? Social situations can be difficult when you are quitting smoking, especially in the first few weeks. To manage this, you can:  Avoid parties, bars, and other social situations where people might be smoking.  Avoid alcohol.  Leave right away if you have the urge to smoke.  Explain to your family and friends that you are quitting smoking. Ask for understanding and support.  Plan activities with friends or family where smoking is not an option.  What are some ways I can cope with stress? Wanting to smoke may cause stress, and stress can make you want to smoke. Find ways to manage your stress. Relaxation techniques can help. For example:  Breathe slowly and deeply, in through your nose and out through your mouth.  Listen to soothing, relaxing music.  Talk with a family member or friend about your stress.  Light a candle.  Soak in a bath or take a shower.  Think about a peaceful place.  What are some ways I can prevent weight gain? Be aware that many people gain weight after they quit smoking. However, not everyone does. To keep from gaining weight, have a plan in place before you quit and stick to the plan after you quit. Your plan should include:  Having healthy snacks. When you have a craving, it may help to: ? Eat plain popcorn, crunchy carrots, celery, or other cut vegetables. ? Chew sugar-free gum.  Changing how you eat: ? Eat small portion sizes at meals. ?   Eat 4-6 small meals throughout the day instead of 1-2 large meals a day. ? Be mindful when you eat. Do not watch television or do other things that might distract you as you eat.  Exercising regularly: ? Make time to exercise each day. If you do not have time for a long workout, do short bouts of exercise for 5-10 minutes several times a day. ? Do some form of strengthening exercise, like weight lifting, and some form of aerobic exercise, like running or  swimming.  Drinking plenty of water or other low-calorie or no-calorie drinks. Drink 6-8 glasses of water daily, or as much as instructed by your health care provider.  Summary  Quitting smoking is a physical and mental challenge. You will face cravings, withdrawal symptoms, and temptation to smoke again. Preparation can help you as you go through these challenges.  You can cope with cravings by keeping your mouth busy (such as by chewing gum), keeping your body and hands busy, and making calls to family, friends, or a helpline for people who want to quit smoking.  You can cope with withdrawal symptoms by avoiding places where people smoke, avoiding drinks with caffeine, and getting plenty of rest.  Ask your health care provider about the different ways to prevent weight gain, avoid stress, and handle social situations. This information is not intended to replace advice given to you by your health care provider. Make sure you discuss any questions you have with your health care provider. Document Released: 01/05/2016 Document Revised: 01/05/2016 Document Reviewed: 01/05/2016 Elsevier Interactive Patient Education  2018 [employer]  

## 2017-10-29 DIAGNOSIS — K219 Gastro-esophageal reflux disease without esophagitis: Secondary | ICD-10-CM | POA: Diagnosis not present

## 2017-10-29 DIAGNOSIS — R195 Other fecal abnormalities: Secondary | ICD-10-CM | POA: Diagnosis not present

## 2017-10-29 DIAGNOSIS — G4730 Sleep apnea, unspecified: Secondary | ICD-10-CM | POA: Diagnosis not present

## 2017-11-11 ENCOUNTER — Encounter: Payer: Self-pay | Admitting: Physician Assistant

## 2017-11-11 DIAGNOSIS — D124 Benign neoplasm of descending colon: Secondary | ICD-10-CM | POA: Diagnosis not present

## 2017-11-11 DIAGNOSIS — D125 Benign neoplasm of sigmoid colon: Secondary | ICD-10-CM | POA: Diagnosis not present

## 2017-11-11 DIAGNOSIS — D123 Benign neoplasm of transverse colon: Secondary | ICD-10-CM | POA: Diagnosis not present

## 2017-11-11 DIAGNOSIS — D120 Benign neoplasm of cecum: Secondary | ICD-10-CM | POA: Diagnosis not present

## 2017-11-11 DIAGNOSIS — D122 Benign neoplasm of ascending colon: Secondary | ICD-10-CM | POA: Diagnosis not present

## 2017-11-11 DIAGNOSIS — Z1211 Encounter for screening for malignant neoplasm of colon: Secondary | ICD-10-CM | POA: Diagnosis not present

## 2017-11-11 DIAGNOSIS — R195 Other fecal abnormalities: Secondary | ICD-10-CM | POA: Diagnosis not present

## 2017-11-11 DIAGNOSIS — K635 Polyp of colon: Secondary | ICD-10-CM | POA: Diagnosis not present

## 2017-11-11 LAB — HM COLONOSCOPY

## 2017-11-12 ENCOUNTER — Encounter: Payer: Self-pay | Admitting: Physician Assistant

## 2017-11-21 ENCOUNTER — Telehealth: Payer: Self-pay

## 2017-11-21 NOTE — Telephone Encounter (Signed)
Called pt, na. Left vm for pt to call in with update on condition per [place]. Information regarding this call in Santiago's box at the nurses station.

## 2018-01-05 NOTE — Progress Notes (Signed)
COLONOSCOPY PROCEDURE:  Eight 12 to 20 mm ployps in the sigmoid colon, in the descending colon, in the transverse colon, in the ascending colon and in the cecum, removed with hot snare. Resected and retrieved. Repeat colonoscopy in 1 year for surveillance.

## 2018-02-26 DIAGNOSIS — G4733 Obstructive sleep apnea (adult) (pediatric): Secondary | ICD-10-CM | POA: Diagnosis not present

## 2018-04-23 ENCOUNTER — Other Ambulatory Visit: Payer: Self-pay | Admitting: Physician Assistant

## 2018-04-23 DIAGNOSIS — J302 Other seasonal allergic rhinitis: Secondary | ICD-10-CM

## 2018-06-04 DIAGNOSIS — G4733 Obstructive sleep apnea (adult) (pediatric): Secondary | ICD-10-CM | POA: Diagnosis not present

## 2018-08-28 DIAGNOSIS — E782 Mixed hyperlipidemia: Secondary | ICD-10-CM | POA: Diagnosis not present

## 2018-08-28 DIAGNOSIS — K219 Gastro-esophageal reflux disease without esophagitis: Secondary | ICD-10-CM | POA: Diagnosis not present

## 2018-08-28 DIAGNOSIS — F331 Major depressive disorder, recurrent, moderate: Secondary | ICD-10-CM | POA: Diagnosis not present

## 2018-08-28 DIAGNOSIS — M542 Cervicalgia: Secondary | ICD-10-CM | POA: Diagnosis not present

## 2018-08-28 DIAGNOSIS — F17210 Nicotine dependence, cigarettes, uncomplicated: Secondary | ICD-10-CM | POA: Diagnosis not present

## 2018-08-28 DIAGNOSIS — G4730 Sleep apnea, unspecified: Secondary | ICD-10-CM | POA: Diagnosis not present

## 2018-08-28 DIAGNOSIS — F411 Generalized anxiety disorder: Secondary | ICD-10-CM | POA: Diagnosis not present

## 2018-08-28 DIAGNOSIS — Z716 Tobacco abuse counseling: Secondary | ICD-10-CM | POA: Diagnosis not present

## 2018-08-28 DIAGNOSIS — E6609 Other obesity due to excess calories: Secondary | ICD-10-CM | POA: Diagnosis not present

## 2018-10-01 DIAGNOSIS — I10 Essential (primary) hypertension: Secondary | ICD-10-CM | POA: Diagnosis not present

## 2018-10-01 DIAGNOSIS — E782 Mixed hyperlipidemia: Secondary | ICD-10-CM | POA: Diagnosis not present

## 2018-10-08 ENCOUNTER — Other Ambulatory Visit (HOSPITAL_COMMUNITY): Payer: Self-pay | Admitting: Internal Medicine

## 2018-10-08 DIAGNOSIS — F17210 Nicotine dependence, cigarettes, uncomplicated: Secondary | ICD-10-CM | POA: Diagnosis not present

## 2018-10-08 DIAGNOSIS — M542 Cervicalgia: Secondary | ICD-10-CM | POA: Diagnosis not present

## 2018-10-08 DIAGNOSIS — F331 Major depressive disorder, recurrent, moderate: Secondary | ICD-10-CM | POA: Diagnosis not present

## 2018-10-08 DIAGNOSIS — Z6834 Body mass index (BMI) 34.0-34.9, adult: Secondary | ICD-10-CM | POA: Diagnosis not present

## 2018-10-08 DIAGNOSIS — K219 Gastro-esophageal reflux disease without esophagitis: Secondary | ICD-10-CM | POA: Diagnosis not present

## 2018-10-08 DIAGNOSIS — F411 Generalized anxiety disorder: Secondary | ICD-10-CM | POA: Diagnosis not present

## 2018-10-08 DIAGNOSIS — R944 Abnormal results of kidney function studies: Secondary | ICD-10-CM | POA: Diagnosis not present

## 2018-10-08 DIAGNOSIS — E782 Mixed hyperlipidemia: Secondary | ICD-10-CM | POA: Diagnosis not present

## 2018-10-08 DIAGNOSIS — G4730 Sleep apnea, unspecified: Secondary | ICD-10-CM | POA: Diagnosis not present

## 2018-10-08 DIAGNOSIS — E6609 Other obesity due to excess calories: Secondary | ICD-10-CM | POA: Diagnosis not present

## 2018-10-08 DIAGNOSIS — Z0001 Encounter for general adult medical examination with abnormal findings: Secondary | ICD-10-CM | POA: Diagnosis not present

## 2018-10-08 DIAGNOSIS — N182 Chronic kidney disease, stage 2 (mild): Secondary | ICD-10-CM | POA: Diagnosis not present

## 2018-10-08 DIAGNOSIS — R350 Frequency of micturition: Secondary | ICD-10-CM | POA: Diagnosis not present

## 2018-10-08 DIAGNOSIS — I10 Essential (primary) hypertension: Secondary | ICD-10-CM | POA: Diagnosis not present

## 2018-10-08 DIAGNOSIS — Z1231 Encounter for screening mammogram for malignant neoplasm of breast: Secondary | ICD-10-CM

## 2018-10-08 DIAGNOSIS — N390 Urinary tract infection, site not specified: Secondary | ICD-10-CM | POA: Diagnosis not present

## 2018-11-02 ENCOUNTER — Ambulatory Visit (INDEPENDENT_AMBULATORY_CARE_PROVIDER_SITE_OTHER): Payer: Medicare HMO | Admitting: Neurology

## 2018-11-02 ENCOUNTER — Encounter: Payer: Self-pay | Admitting: Neurology

## 2018-11-02 ENCOUNTER — Other Ambulatory Visit: Payer: Self-pay

## 2018-11-02 VITALS — BP 164/111 | HR 90 | Temp 97.3°F | Ht 60.0 in | Wt 170.0 lb

## 2018-11-02 DIAGNOSIS — G4733 Obstructive sleep apnea (adult) (pediatric): Secondary | ICD-10-CM | POA: Diagnosis not present

## 2018-11-02 DIAGNOSIS — R05 Cough: Secondary | ICD-10-CM | POA: Insufficient documentation

## 2018-11-02 DIAGNOSIS — F17219 Nicotine dependence, cigarettes, with unspecified nicotine-induced disorders: Secondary | ICD-10-CM | POA: Diagnosis not present

## 2018-11-02 DIAGNOSIS — Z9989 Dependence on other enabling machines and devices: Secondary | ICD-10-CM

## 2018-11-02 DIAGNOSIS — G478 Other sleep disorders: Secondary | ICD-10-CM | POA: Diagnosis not present

## 2018-11-02 DIAGNOSIS — R053 Chronic cough: Secondary | ICD-10-CM

## 2018-11-02 NOTE — Progress Notes (Signed)
S[facility]EP [facility]   Provider:  Larey Seat, M D  Primary Care Physician:  Celene Squibb, MD   Referring Provider: Tereasa Coop, PA-C   Chief Complaint  Patient presents with   Follow-up    pt alone, rm 11. pt states things are well. DME [facility]    HPI:  Janice Hamilton is a 68 y.o. female caucasian Patient, seen here on 11-02-2018 for CPAP compliance, the patient is an active smoker, has chronic cough, OSA and UARS.  The patient endorsed 2 out of 15 points on the geriatric depression score, 21 points on the fatigue severity score and 8 out of 24 points on the Epworth Sleepiness Scale.  Her compliance report shows that she used the machine 26 out of 30 days but for the first time ever she was only on 20 days compliant with the minimum 4-hour use time.  Average user time is 4 hours and 24 minutes now down from over 5 hours in our last meeting.  Also CPAP is an auto sense it was set at 11 cmH2O with 3 cm EPR and her residual AHI is 0.5/h.  This speaks for an excellent resolution of apnea.  She still has some rare hours which are probably snoring or coughing related interruptions and regular breathing.  However index was 1.5/h.  She does have high air leakage which is typical in nasal pillow uses.  We discussed today that 3 out of 5 days with the lowest user time on CPAP were Saturdays and she is known to go to bed much later on a Saturday I asked her to increase to use a time making sure that even on Saturdays she gets 4-1/2 hours or more of sleep with CPAP.    Otherwise I do not have any report of intermittent medical complications, I would expect her PCP to be happy to offer her Chantix or Wellbutrin to help stop smoking and expect that she will tell me if she needs assistance.     10-27-2017 for a CPAP compliance visit. I have the pleasure to meet with Janice Hamilton today, who has remained a very highly compliant CPAP user.  The patient has 100% use at compliance 4 days, and 97%  compliance 4 hours.  He averages 6 hours and 35 minutes each night.  Her residual AHI is 1.1/h, in spite of rather high air leaks there is excellent control of her apnea.  Set pressure is 11 cmH2O with 3 cm EPR, her medication list still includes Celexa, Flexeril, Ativan and Zyrtec.  Her fatigue severity score was endorsed at 18 points and her Epworth sleepiness score was endorsed at 10 points out of 24.  She endorsed only 2 points out of 15 and the geriatric depression score. She continues to smoke. She has failed to quit - smokes 15  cig. a day.   I have the pleasure of seeing Janice Hamilton today on 10/21/2016 in a revisit, the patient was tested in a split-night polysomnography on 06/21/2018[name], M.D. the sleep study confirmed a very severe form of sleep apnea, her AHI was 60 per hour-this equals 1 apnea or shallow breathing spells per minute of sleep, her RDI was 91.5-indicating loud snoring and upper airway resistance. Desaturation was borderline the patient spent only 25 minutes of sleep time at or below 88%, she did not have PLMS, her heart rate was in sinus rhythm, remarkable was that she took 3 bathroom breaks within 2 hours. Once CPAP was initiated, the  patient's nocturia resolved, she was titrated to 11 cm water and was given a small size fullface mask by the technologist. EPR of 2 cm was added, her sleep became sustained and refreshing. In the morning after the titration the patient already indicated that her sleep was much better than usual. I have also the opportunity to review her CPAP compliance today she's 100% compliant with 7 hours and 6 minutes of nightly use, AHI is 0.4, using a full face mask. Fatigue severity scale was reduced to 25 and Epworth sleepiness score to 7 points from previously 17.  The patient reports that she sleeps so much better, feels so much more alert and asleep a sound and no longer interrupted by bathroom breaks as frequently.  She has just started an exercise  regimen because now she feels the energy needed to take those steps. She hopes to lose some weight..         Chief complaint according to patient :" I snore so bad- so embarrassing "  05-2016 consult with CD [name] is a 66 -year-old Caucasian right-handed female who has a history of chronic leg pain, hip bursitis, cramping, excessive daytime fatigue, respiratory allergies, depression and hypertension. The patient had a surgical abdominal hysterectomy. Over the last 3 years or so her snoring has evolved into a very loud, thunderous nocturnal breathing and her partner for 27 years is no longer sleeping in the same room. Her partner has multiple times told her that she stops to breath-  very upsetting to him. Only recently was she advised that her grandson ( 7)  to stay awake for just 5 minutes and not snore so he can sleep.  Sleep habits are as follows: Janice Hamilton reports that she falls asleep every evening while watching TV. She falls asleep whenever she is not physically active on mentally stimulated. She often feels not tired but the sleep overwhelms her. She spends the morning hours at her daughter's house between 9 and 12 and takes care of her dog. She falls asleep or struggles with sleepiness driving to her daughter's home and back, mostly on the way back. She takes a nap every day afternoon after lunch, duration up to 2 hours, and  by 9 PM  she is asleep no matter what she is watching on TV. Ray, her partner, will wake her up between 34 and 11 usually when her snoring in the living room has started to bother him, and she will go to her bedroom. She has no trouble continuing to sleep there, she wakes up about every 2 hours or so sometimes has to go to the bathroom, sometimes she just wakes up perhaps because of snoring or apnea. She rises in the morning at 6 AM and feels exhausted.  Sleep medical history and family sleep history: no sleep walking or bedwetting history, OSA suspected to be  present for the last 27 years.   Her parents passed away when she was young and she does not recall any sleep problems affecting them, she has a sister who is healthy.  Social history: Retired from a [employer] year [employer] .  lives with her partner of 27 years , Ray . Ray is 22 years younger . Adult child daughter , now 71. She  is an active smoker and does not drink alcohol, she drinks cog fee in AM, iced tea throughout the day , 2-3 , no sodas.   Mrs. Wiemken has very high risk factors for obstructive sleep  apnea and she herself is pretty sure that she have it. She is a loud snorer, the amplitude of snoring has increased continuously over the last years and her partner has witnessed her to stop breathing. She has become extremely fatigued in daytime with the fatigue severity score 49 and very sleepy with an Epworth score of 17. Based on the significant impairment in her daily functioning due to not getting good sleep I would like her to have a split-night polysomnography as soon as possible. I would like to marks the study is urgent. She wakes up with a dry mouth but not with headaches, and I do not think we need capnography to be included in the setting.   Review of Systems: Out of a complete 14 system review, the patient complains of only the following symptoms, and all other reviewed systems are negative.  Her fatigue severity score was endorsed at 18 points and her Epworth sleepiness score was endorsed at 10 points out of 24.  She endorsed only 2 points out of 15 and the geriatric depression score. 5-12- 2018 :Epworth score 17 points , Fatigue severity score 49 points , depression score n/a . 10-21-2016, on PAP: Epworth 5, FSS 25, geriatric depression score 1/15    11-02-2018 How likely are you to doze in the following situations: 0 = not likely, 1 = slight chance, 2 = moderate chance, 3 = high chance  Sitting and Reading? Watching Television? Sitting inactive in a public place  (theater or meeting)? Lying down in the afternoon when circumstances permit? Sitting and talking to someone? Sitting quietly after lunch without alcohol? In a car, while stopped for a few minutes in traffic? As a passenger in a car for an hour without a break?  Total = The patient endorsed 2 out of 15 points on the geriatric depression score, 21 points on the fatigue severity score and 8 out of 24 points on the Epworth Sleepiness Scale.    Social History   Socioeconomic History   Marital status: Divorced    Spouse name: Not on file   Number of children: 1   Years of education: Not on file   Highest education level: High school graduate  Occupational History   Not on file  Social Needs   Financial resource strain: Not hard at all   Food insecurity    Worry: Never true    Inability: Never true   Transportation needs    Medical: No    Non-medical: No  Tobacco Use   Smoking status: Current Every Day Smoker    Packs/day: 0.50    Types: Cigarettes   Smokeless tobacco: Never Used  Substance and Sexual Activity   Alcohol use: No   Drug use: No   Sexual activity: Not on file  Lifestyle   Physical activity    Days per week: 0 days    Minutes per session: 0 min   Stress: Not at all  Relationships   Social connections    Talks on phone: More than three times a week    Gets together: More than three times a week    Attends religious service: Never    Active member of club or organization: No    Attends meetings of clubs or organizations: Never    Relationship status: Divorced   Intimate partner violence    Fear of current or ex partner: No    Emotionally abused: No    Physically abused: No    Forced sexual activity:  No  Other Topics Concern   Not on file  Social History Narrative   Not on file    Family History  Problem Relation Age of Onset   Diabetes Mother    Stroke Father    Hypothyroidism Sister     Past Medical History:  Diagnosis  Date   Allergy    Anxiety    Blood transfusion without reported diagnosis    Depression    Hypertension     Past Surgical History:  Procedure Laterality Date   ABDOMINAL HYSTERECTOMY      Current Outpatient Medications  Medication Sig Dispense Refill   citalopram (CELEXA) 20 MG tablet Take 1 tablet (20 mg total) by mouth daily. 90 tablet 3   cyclobenzaprine (FLEXERIL) 10 MG tablet Take 0.5-1 tablets (5-10 mg total) by mouth 3 (three) times daily as needed for muscle spasms. Do not mix with narcotics. May cause drowsiness. 270 tablet 3   EQ ALLERGY RELIEF, CETIRIZINE, 10 MG tablet Take 1 tablet by mouth once daily 90 tablet 0   LORazepam (ATIVAN) 0.5 MG tablet Take 0.5-1 tablets (0.25-0.5 mg total) by mouth daily as needed for anxiety. 30 tablet 1   pantoprazole (PROTONIX) 40 MG tablet Take 1 tablet (40 mg total) by mouth daily. 90 tablet 3   rosuvastatin (CRESTOR) 5 MG tablet Take 1 tablet (5 mg total) by mouth daily. 90 tablet 3   No current facility-administered medications for this visit.     Allergies as of 11/02/2018   (No Known Allergies)    Vitals: BP (!) 164/111    Pulse 90    Temp (!) 97.3 F (36.3 C)    Ht 5' (1.524 m)    Wt 170 lb (77.1 kg)    BMI 33.20 kg/m  Last Weight:  Wt Readings from Last 1 Encounters:  11/02/18 170 lb (77.1 kg)   [id number] mass index is 33.2 kg/m.     Last Height:   Ht Readings from Last 1 Encounters:  11/02/18 5' (1.524 m)    Physical exam:  General: The patient is awake, alert and appears not in acute distress. The patient is well groomed. Head: Normocephalic, atraumatic. Neck is supple. Mallampati 5 - extremely small upper airway.  neck circumference: 17.00 Nasal airflow congestion, TMJ click is not evident. Retrognathia is seen.  Tooth grinding marks- bruxism.  Cardiovascular:  Regular rate and rhythm. Respiratory:  There is wheezing and rhonci- patient is very congested but feels still that CPAP is comfortable to  use.  Skin:  Without evidence of edema, or rash.  Trunk: BMI is 33. The patient's posture is for the first time stooped.   Neurologic exam : The patient is awake and alert, oriented to place and time.    Speech is fluent,  with dysphonia, she is very hoarse.  Mood and affect are appropriate.  Cranial nerves:  Chronic nasal congestion, smell is affected, not taste - Pupils are equal and briskly reactive to light.  Extraocular movements  in vertical and horizontal planes intact and without nystagmus. Visual fields by finger perimetry are intact. Hearing to finger rub impaired. Facial sensation intact to fine touch.Facial motor strength is symmetric and tongue and uvula move midline. Shoulder shrug was symmetrical.  Sensory:  unchanged Fine touch, pinprick and vibration were tested in all extremities. Proprioception tested in the upper extremities was normal. Motor exam: unchanged Normal tone, muscle bulk and symmetric strength in all extremities.Patient walks without assistive device - Strength within normal limits.  Stance is stable and normal.  Walking without drifting. Deep tendon reflexes: in the  upper and lower extremities are symmetric - no clonus, no leg cramps.  Assessment:  After physical and neurologic examination, review of laboratory studies,  Personal review of polysomnography and / or neurophysiology testing and pre-existing records as far as provided in visit., my assessment is;  1)  Hypersomnia resolved - was clearly a result of severe OSA ( AHI was 60/hr) and UARS -RDI was over 90/h . Had  extreme daytime sleepiness at  Epworth 17 , and fatigue in MAY 2018 visit-  Now much lower ! benefits form PAP therapy.  *She loves using CPAP ! No more nocturia, successfully controlling witnessed snoring and apnea. SPLIT study from 07-11-2016 reviewed.   2) Obesity - has no exercise or dietary rules.  BMI 33.2 - this is stable.   3) Weight loss discussion- needs to speak to Lillette Boxer,  MD   4) Smoking cessation, I offered a referral- she declined.  Coughing is still present - and affects her sleep, also she states it does not.   The patient was advised of the nature of the diagnosed disorder , the treatment options and the  risks for general health and wellness arising from not treating the condition.   I spent more than 20 minutes of face to face time with the patient.  Greater than 50% of time was spent in counseling and coordination of care.  We have discussed the diagnosis and differential and I answered the patient's questions.    Plan:  Treatment plan and additional workup :  Continue using your auto pap capable CPAP at 12 with 3 cm EPR ! You are doing well.  Tell me if the air leaks bother you- we can always send to mask refitting   You need to make up for the later bed time on Saturdays by using the machine 1 hours longer. Its Saturdays when you fall short of compliance.    RV in 12 month with NP.   Larey Seat, MD [id number], [id number] AM  Certified in Neurology by [employer] Certified in [place] by [facility] [address] [place], [place] [address]

## 2018-11-02 NOTE — Patient Instructions (Signed)
Chronic Obstructive Pulmonary Disease °Chronic obstructive pulmonary disease (COPD) is a long-term (chronic) lung problem. When you have COPD, it is hard for air to get in and out of your lungs. Usually the condition gets worse over time, and your lungs will never return to normal. There are things you can do to keep yourself as healthy as possible. °· Your doctor may treat your condition with: °? Medicines. °? Oxygen. °? Lung surgery. °· Your doctor may also recommend: °? Rehabilitation. This includes steps to make your body work better. It may involve a team of specialists. °? Quitting smoking, if you smoke. °? Exercise and changes to your diet. °? Comfort measures (palliative care). °Follow these instructions at home: °Medicines °· Take over-the-counter and prescription medicines only as told by your doctor. °· Talk to your doctor before taking any cough or allergy medicines. You may need to avoid medicines that cause your lungs to be dry. °Lifestyle °· If you smoke, stop. Smoking makes the problem worse. If you need help quitting, ask your doctor. °· Avoid being around things that make your breathing worse. This may include smoke, chemicals, and fumes. °· Stay active, but remember to rest as well. °· Learn and use tips on how to relax. °· Make sure you get enough sleep. Most adults need at least 7 hours of sleep every night. °· Eat healthy foods. Eat smaller meals more often. Rest before meals. °Controlled breathing °Learn and use tips on how to control your breathing as told by your doctor. Try: °· Breathing in (inhaling) through your nose for 1 second. Then, pucker your lips and breath out (exhale) through your lips for 2 seconds. °· Putting one hand on your belly (abdomen). Breathe in slowly through your nose for 1 second. Your hand on your belly should move out. Pucker your lips and breathe out slowly through your lips. Your hand on your belly should move in as you breathe out. ° °Controlled coughing °Learn  and use controlled coughing to clear mucus from your lungs. Follow these steps: °1. Lean your head a little forward. °2. Breathe in deeply. °3. Try to hold your breath for 3 seconds. °4. Keep your mouth slightly open while coughing 2 times. °5. Spit any mucus out into a tissue. °6. Rest and do the steps again 1 or 2 times as needed. °General instructions °· Make sure you get all the shots (vaccines) that your doctor recommends. Ask your doctor about a flu shot and a pneumonia shot. °· Use oxygen therapy and pulmonary rehabilitation if told by your doctor. If you need home oxygen therapy, ask your doctor if you should buy a tool to measure your oxygen level (oximeter). °· Make a COPD action plan with your doctor. This helps you to know what to do if you feel worse than usual. °· Manage any other conditions you have as told by your doctor. °· Avoid going outside when it is very hot, cold, or humid. °· Avoid people who have a sickness you can catch (contagious). °· Keep all follow-up visits as told by your doctor. This is important. °Contact a doctor if: °· You cough up more mucus than usual. °· There is a change in the color or thickness of the mucus. °· It is harder to breathe than usual. °· Your breathing is faster than usual. °· You have trouble sleeping. °· You need to use your medicines more often than usual. °· You have trouble doing your normal activities such as getting dressed   or walking around the house. °Get help right away if: °· You have shortness of breath while resting. °· You have shortness of breath that stops you from: °? Being able to talk. °? Doing normal activities. °· Your chest hurts for longer than 5 minutes. °· Your skin color is more blue than usual. °· Your pulse oximeter shows that you have low oxygen for longer than 5 minutes. °· You have a fever. °· You feel too tired to breathe normally. °Summary °· Chronic obstructive pulmonary disease (COPD) is a long-term lung problem. °· The way your  lungs work will never return to normal. Usually the condition gets worse over time. There are things you can do to keep yourself as healthy as possible. °· Take over-the-counter and prescription medicines only as told by your doctor. °· If you smoke, stop. Smoking makes the problem worse. °This information is not intended to replace advice given to you by your health care provider. Make sure you discuss any questions you have with your health care provider. °Document Released: 06/26/2007 Document Revised: 12/20/2016 Document Reviewed: 02/12/2016 °Elsevier Patient Education © 2020 [employer] ° °

## 2018-11-23 DIAGNOSIS — G4733 Obstructive sleep apnea (adult) (pediatric): Secondary | ICD-10-CM | POA: Diagnosis not present

## 2019-05-07 ENCOUNTER — Other Ambulatory Visit (HOSPITAL_COMMUNITY): Payer: Self-pay | Admitting: Internal Medicine

## 2019-05-07 DIAGNOSIS — Z1231 Encounter for screening mammogram for malignant neoplasm of breast: Secondary | ICD-10-CM

## 2019-05-07 DIAGNOSIS — Z13820 Encounter for screening for osteoporosis: Secondary | ICD-10-CM

## 2019-07-12 DIAGNOSIS — G4733 Obstructive sleep apnea (adult) (pediatric): Secondary | ICD-10-CM | POA: Diagnosis not present

## 2019-11-02 DIAGNOSIS — E1169 Type 2 diabetes mellitus with other specified complication: Secondary | ICD-10-CM | POA: Diagnosis not present

## 2019-11-02 DIAGNOSIS — G4730 Sleep apnea, unspecified: Secondary | ICD-10-CM | POA: Diagnosis not present

## 2019-11-02 DIAGNOSIS — Z713 Dietary counseling and surveillance: Secondary | ICD-10-CM | POA: Diagnosis not present

## 2019-11-02 DIAGNOSIS — R350 Frequency of micturition: Secondary | ICD-10-CM | POA: Diagnosis not present

## 2019-11-05 DIAGNOSIS — K219 Gastro-esophageal reflux disease without esophagitis: Secondary | ICD-10-CM | POA: Diagnosis not present

## 2019-11-05 DIAGNOSIS — F17210 Nicotine dependence, cigarettes, uncomplicated: Secondary | ICD-10-CM | POA: Diagnosis not present

## 2019-11-05 DIAGNOSIS — E782 Mixed hyperlipidemia: Secondary | ICD-10-CM | POA: Diagnosis not present

## 2019-11-05 DIAGNOSIS — E1169 Type 2 diabetes mellitus with other specified complication: Secondary | ICD-10-CM | POA: Diagnosis not present

## 2019-11-08 ENCOUNTER — Ambulatory Visit: Payer: Medicare HMO | Admitting: Adult Health

## 2020-03-03 DIAGNOSIS — G4733 Obstructive sleep apnea (adult) (pediatric): Secondary | ICD-10-CM | POA: Diagnosis not present

## 2020-03-06 ENCOUNTER — Ambulatory Visit: Payer: Medicare Other | Admitting: Adult Health

## 2020-03-07 DIAGNOSIS — R051 Acute cough: Secondary | ICD-10-CM | POA: Diagnosis not present

## 2020-03-07 DIAGNOSIS — J069 Acute upper respiratory infection, unspecified: Secondary | ICD-10-CM | POA: Diagnosis not present

## 2020-06-05 DIAGNOSIS — N1831 Chronic kidney disease, stage 3a: Secondary | ICD-10-CM | POA: Diagnosis not present

## 2020-06-05 DIAGNOSIS — E1169 Type 2 diabetes mellitus with other specified complication: Secondary | ICD-10-CM | POA: Diagnosis not present

## 2020-06-05 DIAGNOSIS — I10 Essential (primary) hypertension: Secondary | ICD-10-CM | POA: Diagnosis not present

## 2020-06-05 DIAGNOSIS — M542 Cervicalgia: Secondary | ICD-10-CM | POA: Diagnosis not present

## 2020-06-05 DIAGNOSIS — E782 Mixed hyperlipidemia: Secondary | ICD-10-CM | POA: Diagnosis not present

## 2020-06-09 DIAGNOSIS — E1169 Type 2 diabetes mellitus with other specified complication: Secondary | ICD-10-CM | POA: Diagnosis not present

## 2020-06-09 DIAGNOSIS — F32A Depression, unspecified: Secondary | ICD-10-CM | POA: Diagnosis not present

## 2020-06-09 DIAGNOSIS — G4730 Sleep apnea, unspecified: Secondary | ICD-10-CM | POA: Diagnosis not present

## 2020-06-09 DIAGNOSIS — I10 Essential (primary) hypertension: Secondary | ICD-10-CM | POA: Diagnosis not present

## 2020-06-09 DIAGNOSIS — E782 Mixed hyperlipidemia: Secondary | ICD-10-CM | POA: Diagnosis not present

## 2020-06-09 DIAGNOSIS — N1831 Chronic kidney disease, stage 3a: Secondary | ICD-10-CM | POA: Diagnosis not present

## 2020-06-09 DIAGNOSIS — K581 Irritable bowel syndrome with constipation: Secondary | ICD-10-CM | POA: Diagnosis not present

## 2020-06-09 DIAGNOSIS — K219 Gastro-esophageal reflux disease without esophagitis: Secondary | ICD-10-CM | POA: Diagnosis not present

## 2020-06-09 DIAGNOSIS — M542 Cervicalgia: Secondary | ICD-10-CM | POA: Diagnosis not present

## 2020-06-09 DIAGNOSIS — E1165 Type 2 diabetes mellitus with hyperglycemia: Secondary | ICD-10-CM | POA: Diagnosis not present

## 2020-06-12 ENCOUNTER — Encounter: Payer: Self-pay | Admitting: Adult Health

## 2020-06-12 ENCOUNTER — Ambulatory Visit: Payer: Medicare Other | Admitting: Adult Health

## 2020-06-12 ENCOUNTER — Other Ambulatory Visit: Payer: Self-pay

## 2020-06-12 VITALS — BP 157/93 | HR 77 | Ht 59.0 in | Wt 166.0 lb

## 2020-06-12 DIAGNOSIS — Z9989 Dependence on other enabling machines and devices: Secondary | ICD-10-CM

## 2020-06-12 DIAGNOSIS — G4733 Obstructive sleep apnea (adult) (pediatric): Secondary | ICD-10-CM | POA: Diagnosis not present

## 2020-06-12 NOTE — Patient Instructions (Signed)
Continue using CPAP nightly and greater than 4 hours each night °If your symptoms worsen or you develop new symptoms please let us know.  ° °

## 2020-06-12 NOTE — Progress Notes (Signed)
PATIENT: Janice Hamilton DOB: 10-04-1950  REASON FOR VISIT: follow up HISTORY FROM: patient  HISTORY OF PRESENT ILLNESS: Today 06/12/20:  Janice Hamilton is a 70 year old female with a history of obstructive sleep apnea on CPAP.  She returns today for follow-up.  She reports that there are some nights that she takes her machine off without knowing.  She also reports that some nights she deals with allergies and a headache and cannot wear it for very long.  She does find it beneficial.  Denies the mask leaking.  Her download is below.    HISTORY 10/27/2017: I have the pleasure to meet with Janice Hamilton today, who has remained a very highly compliant CPAP user.  The patient has 100% use at compliance 4 days, and 97% compliance 4 hours.  He averages 6 hours and 35 minutes each night.  Her residual AHI is 1.1/h, in spite of rather high air leaks there is excellent control of her apnea.  Set pressure is 11 cmH2O with 3 cm EPR, her medication list still includes Celexa, Flexeril, Ativan and Zyrtec.  Her fatigue severity score was endorsed at 18 points and her Epworth sleepiness score was endorsed at 10 points out of 24.  She endorsed only 2 points out of 15 and the geriatric depression score. She continues to smoke. She has failed to quit - smokes 15  cig. a day.    REVIEW OF SYSTEMS: Out of a complete 14 system review of symptoms, the patient complains only of the following symptoms, and all other reviewed systems are negative.  FSS 22 ESS 18  ALLERGIES: No Known Allergies  HOME MEDICATIONS: Outpatient Medications Prior to Visit  Medication Sig Dispense Refill  . citalopram (CELEXA) 20 MG tablet Take 1 tablet (20 mg total) by mouth daily. 90 tablet 3  . cyclobenzaprine (FLEXERIL) 10 MG tablet Take 0.5-1 tablets (5-10 mg total) by mouth 3 (three) times daily as needed for muscle spasms. Do not mix with narcotics. May cause drowsiness. 270 tablet 3  . EQ ALLERGY RELIEF, CETIRIZINE, 10 MG tablet  Take 1 tablet by mouth once daily 90 tablet 0  . LORazepam (ATIVAN) 0.5 MG tablet Take 0.5-1 tablets (0.25-0.5 mg total) by mouth daily as needed for anxiety. 30 tablet 1  . pantoprazole (PROTONIX) 40 MG tablet Take 1 tablet (40 mg total) by mouth daily. 90 tablet 3  . rosuvastatin (CRESTOR) 5 MG tablet Take 1 tablet (5 mg total) by mouth daily. 90 tablet 3   No facility-administered medications prior to visit.    PAST MEDICAL HISTORY: Past Medical History:  Diagnosis Date  . Allergy   . Anxiety   . Blood transfusion without reported diagnosis   . Depression   . Hypertension     PAST SURGICAL HISTORY: Past Surgical History:  Procedure Laterality Date  . ABDOMINAL HYSTERECTOMY      FAMILY HISTORY: Family History  Problem Relation Age of Onset  . Diabetes Mother   . Stroke Father   . Hypothyroidism Sister     SOCIAL HISTORY: Social History   Socioeconomic History  . Marital status: Divorced    Spouse name: Not on file  . Number of children: 1  . Years of education: Not on file  . Highest education level: High school graduate  Occupational History  . Not on file  Tobacco Use  . Smoking status: Current Every Day Smoker    Packs/day: 0.50    Types: Cigarettes  . Smokeless tobacco: Never Used  Vaping Use  . Vaping Use: Never used  Substance and Sexual Activity  . Alcohol use: No  . Drug use: No  . Sexual activity: Not on file  Other Topics Concern  . Not on file  Social History Narrative  . Not on file   Social Determinants of Health   Financial Resource Strain: Not on file  Food Insecurity: Not on file  Transportation Needs: Not on file  Physical Activity: Not on file  Stress: Not on file  Social Connections: Not on file  Intimate Partner Violence: Not on file      PHYSICAL EXAM  Vitals:   06/12/20 0825  BP: (!) 157/93  Pulse: 77  Weight: 166 lb (75.3 kg)  Height: 4\' 11"  (1.499 m)   Body mass index is 33.53 kg/m.  Generalized: Well  developed, in no acute distress  Chest: Lungs clear to auscultation bilaterally  Neurological examination  Mentation: Alert oriented to time, place, history taking. Follows all commands speech and language fluent Cranial nerve II-XII: Extraocular movements were full, visual field were full on confrontational test Head turning and shoulder shrug  were normal and symmetric. Motor: The motor testing reveals 5 over 5 strength of all 4 extremities. Good symmetric motor tone is noted throughout.  Sensory: Sensory testing is intact to soft touch on all 4 extremities. No evidence of extinction is noted.  Gait and station: Gait is normal.    DIAGNOSTIC DATA (LABS, IMAGING, TESTING) - I reviewed patient records, labs, notes, testing and imaging myself where available.      Component Value Date/Time   NA 141 04/25/2017 1220   K 4.8 04/25/2017 1220   CL 101 04/25/2017 1220   CO2 22 04/25/2017 1220   GLUCOSE 93 04/25/2017 1220   BUN 15 04/25/2017 1220   CREATININE 1.02 (H) 04/25/2017 1220   CALCIUM 10.4 (H) 04/25/2017 1220   ALBUMIN 4.8 04/25/2017 1220   GFRNONAA 57 (L) 04/25/2017 1220   GFRAA 66 04/25/2017 1220   Lab Results  Component Value Date   CHOL 265 (H) 08/08/2017   HDL 39 (L) 08/08/2017   LDLCALC 179 (H) 08/08/2017   TRIG 236 (H) 08/08/2017   CHOLHDL 6.8 (H) 08/08/2017   Lab Results  Component Value Date   HGBA1C 6.2 (H) 04/25/2017   No results found for: [id number] Lab Results  Component Value Date   TSH 2.320 08/08/2017      ASSESSMENT AND PLAN 71 y.o. year old female  has a past medical history of Allergy, Anxiety, Blood transfusion without reported diagnosis, Depression, and Hypertension. here with:  1. OSA on CPAP  - CPAP compliance suboptimal - Good treatment of AHI  - Encourage patient to use CPAP nightly and > 4 hours each night - F/U in 1 year or sooner if needed  .  Ward Givens, MSN, NP-C 06/12/2020, 8:27 AM [facility] [address] [place], [place] [address] [phone]

## 2020-06-13 ENCOUNTER — Encounter (INDEPENDENT_AMBULATORY_CARE_PROVIDER_SITE_OTHER): Payer: Self-pay | Admitting: *Deleted

## 2020-06-18 DIAGNOSIS — G4733 Obstructive sleep apnea (adult) (pediatric): Secondary | ICD-10-CM | POA: Diagnosis not present

## 2020-06-22 DIAGNOSIS — H6121 Impacted cerumen, right ear: Secondary | ICD-10-CM | POA: Diagnosis not present

## 2020-06-22 DIAGNOSIS — N3281 Overactive bladder: Secondary | ICD-10-CM | POA: Diagnosis not present

## 2020-09-26 DIAGNOSIS — H6990 Unspecified Eustachian tube disorder, unspecified ear: Secondary | ICD-10-CM | POA: Diagnosis not present

## 2020-09-26 DIAGNOSIS — H9193 Unspecified hearing loss, bilateral: Secondary | ICD-10-CM | POA: Diagnosis not present

## 2020-12-25 DIAGNOSIS — I10 Essential (primary) hypertension: Secondary | ICD-10-CM | POA: Diagnosis not present

## 2020-12-25 DIAGNOSIS — E1165 Type 2 diabetes mellitus with hyperglycemia: Secondary | ICD-10-CM | POA: Diagnosis not present

## 2020-12-28 ENCOUNTER — Other Ambulatory Visit (HOSPITAL_COMMUNITY): Payer: Self-pay | Admitting: Family Medicine

## 2020-12-28 DIAGNOSIS — F32A Depression, unspecified: Secondary | ICD-10-CM | POA: Diagnosis not present

## 2020-12-28 DIAGNOSIS — Z13820 Encounter for screening for osteoporosis: Secondary | ICD-10-CM

## 2020-12-28 DIAGNOSIS — Z1211 Encounter for screening for malignant neoplasm of colon: Secondary | ICD-10-CM | POA: Diagnosis not present

## 2020-12-28 DIAGNOSIS — Z1231 Encounter for screening mammogram for malignant neoplasm of breast: Secondary | ICD-10-CM

## 2020-12-28 DIAGNOSIS — M542 Cervicalgia: Secondary | ICD-10-CM | POA: Diagnosis not present

## 2020-12-28 DIAGNOSIS — N1831 Chronic kidney disease, stage 3a: Secondary | ICD-10-CM | POA: Diagnosis not present

## 2020-12-28 DIAGNOSIS — E1165 Type 2 diabetes mellitus with hyperglycemia: Secondary | ICD-10-CM | POA: Diagnosis not present

## 2020-12-28 DIAGNOSIS — Z0001 Encounter for general adult medical examination with abnormal findings: Secondary | ICD-10-CM | POA: Diagnosis not present

## 2020-12-28 DIAGNOSIS — G4730 Sleep apnea, unspecified: Secondary | ICD-10-CM | POA: Diagnosis not present

## 2020-12-28 DIAGNOSIS — K581 Irritable bowel syndrome with constipation: Secondary | ICD-10-CM | POA: Diagnosis not present

## 2020-12-28 DIAGNOSIS — E782 Mixed hyperlipidemia: Secondary | ICD-10-CM | POA: Diagnosis not present

## 2021-01-29 ENCOUNTER — Other Ambulatory Visit: Payer: Self-pay

## 2021-01-29 ENCOUNTER — Ambulatory Visit (HOSPITAL_COMMUNITY)
Admission: RE | Admit: 2021-01-29 | Discharge: 2021-01-29 | Disposition: A | Discharge status: Home / Self Care | Payer: Medicare Other | Source: Ambulatory Visit | Attending: Family Medicine | Admitting: Family Medicine

## 2021-01-29 DIAGNOSIS — Z780 Asymptomatic menopausal state: Secondary | ICD-10-CM | POA: Insufficient documentation

## 2021-01-29 DIAGNOSIS — Z13820 Encounter for screening for osteoporosis: Secondary | ICD-10-CM | POA: Insufficient documentation

## 2021-01-29 DIAGNOSIS — M810 Age-related osteoporosis without current pathological fracture: Secondary | ICD-10-CM | POA: Insufficient documentation

## 2021-01-29 DIAGNOSIS — Z1231 Encounter for screening mammogram for malignant neoplasm of breast: Secondary | ICD-10-CM | POA: Diagnosis not present

## 2021-01-29 DIAGNOSIS — G4733 Obstructive sleep apnea (adult) (pediatric): Secondary | ICD-10-CM | POA: Diagnosis not present

## 2021-01-29 DIAGNOSIS — M85852 Other specified disorders of bone density and structure, left thigh: Secondary | ICD-10-CM | POA: Diagnosis not present

## 2021-01-30 ENCOUNTER — Other Ambulatory Visit (HOSPITAL_COMMUNITY): Payer: Self-pay | Admitting: Family Medicine

## 2021-01-30 DIAGNOSIS — R928 Other abnormal and inconclusive findings on diagnostic imaging of breast: Secondary | ICD-10-CM

## 2021-02-06 ENCOUNTER — Other Ambulatory Visit: Payer: Self-pay

## 2021-02-06 ENCOUNTER — Ambulatory Visit (HOSPITAL_COMMUNITY)
Admission: RE | Admit: 2021-02-06 | Discharge: 2021-02-06 | Disposition: A | Discharge status: Home / Self Care | Payer: Medicare Other | Source: Ambulatory Visit | Attending: Family Medicine | Admitting: Family Medicine

## 2021-02-06 DIAGNOSIS — R928 Other abnormal and inconclusive findings on diagnostic imaging of breast: Secondary | ICD-10-CM

## 2021-02-06 DIAGNOSIS — R922 Inconclusive mammogram: Secondary | ICD-10-CM | POA: Diagnosis not present

## 2021-02-06 DIAGNOSIS — N6322 Unspecified lump in the left breast, upper inner quadrant: Secondary | ICD-10-CM | POA: Diagnosis not present

## 2021-02-06 DIAGNOSIS — N6311 Unspecified lump in the right breast, upper outer quadrant: Secondary | ICD-10-CM | POA: Diagnosis not present

## 2021-02-06 DIAGNOSIS — N6315 Unspecified lump in the right breast, overlapping quadrants: Secondary | ICD-10-CM | POA: Diagnosis not present

## 2021-02-06 DIAGNOSIS — N6313 Unspecified lump in the right breast, lower outer quadrant: Secondary | ICD-10-CM | POA: Diagnosis not present

## 2021-02-19 DIAGNOSIS — M810 Age-related osteoporosis without current pathological fracture: Secondary | ICD-10-CM | POA: Diagnosis not present

## 2021-03-26 DIAGNOSIS — E782 Mixed hyperlipidemia: Secondary | ICD-10-CM | POA: Diagnosis not present

## 2021-03-26 DIAGNOSIS — E1165 Type 2 diabetes mellitus with hyperglycemia: Secondary | ICD-10-CM | POA: Diagnosis not present

## 2021-03-26 DIAGNOSIS — I10 Essential (primary) hypertension: Secondary | ICD-10-CM | POA: Diagnosis not present

## 2021-04-02 ENCOUNTER — Ambulatory Visit (HOSPITAL_COMMUNITY)
Admission: RE | Admit: 2021-04-02 | Discharge: 2021-04-02 | Disposition: A | Discharge status: Home / Self Care | Payer: Medicare Other | Source: Ambulatory Visit | Attending: Family Medicine | Admitting: Family Medicine

## 2021-04-02 ENCOUNTER — Other Ambulatory Visit: Payer: Self-pay

## 2021-04-02 ENCOUNTER — Other Ambulatory Visit: Payer: Self-pay | Admitting: Family Medicine

## 2021-04-02 ENCOUNTER — Other Ambulatory Visit (HOSPITAL_COMMUNITY): Payer: Self-pay | Admitting: Family Medicine

## 2021-04-02 DIAGNOSIS — F32A Depression, unspecified: Secondary | ICD-10-CM | POA: Diagnosis not present

## 2021-04-02 DIAGNOSIS — E1165 Type 2 diabetes mellitus with hyperglycemia: Secondary | ICD-10-CM | POA: Diagnosis not present

## 2021-04-02 DIAGNOSIS — N281 Cyst of kidney, acquired: Secondary | ICD-10-CM | POA: Diagnosis not present

## 2021-04-02 DIAGNOSIS — K219 Gastro-esophageal reflux disease without esophagitis: Secondary | ICD-10-CM | POA: Diagnosis not present

## 2021-04-02 DIAGNOSIS — M810 Age-related osteoporosis without current pathological fracture: Secondary | ICD-10-CM | POA: Diagnosis not present

## 2021-04-02 DIAGNOSIS — G4733 Obstructive sleep apnea (adult) (pediatric): Secondary | ICD-10-CM | POA: Diagnosis not present

## 2021-04-02 DIAGNOSIS — R140 Abdominal distension (gaseous): Secondary | ICD-10-CM | POA: Insufficient documentation

## 2021-04-02 DIAGNOSIS — K581 Irritable bowel syndrome with constipation: Secondary | ICD-10-CM | POA: Diagnosis not present

## 2021-04-02 DIAGNOSIS — R110 Nausea: Secondary | ICD-10-CM | POA: Diagnosis not present

## 2021-04-02 DIAGNOSIS — E782 Mixed hyperlipidemia: Secondary | ICD-10-CM | POA: Diagnosis not present

## 2021-04-02 DIAGNOSIS — N1831 Chronic kidney disease, stage 3a: Secondary | ICD-10-CM | POA: Diagnosis not present

## 2021-04-04 ENCOUNTER — Encounter: Payer: Self-pay | Admitting: Internal Medicine

## 2021-04-09 ENCOUNTER — Other Ambulatory Visit: Payer: Self-pay | Admitting: Internal Medicine

## 2021-04-09 ENCOUNTER — Other Ambulatory Visit (HOSPITAL_COMMUNITY): Payer: Self-pay | Admitting: Internal Medicine

## 2021-04-09 DIAGNOSIS — K8020 Calculus of gallbladder without cholecystitis without obstruction: Secondary | ICD-10-CM

## 2021-04-13 ENCOUNTER — Encounter (HOSPITAL_COMMUNITY): Payer: Self-pay

## 2021-04-13 ENCOUNTER — Encounter (HOSPITAL_COMMUNITY)
Admission: RE | Admit: 2021-04-13 | Discharge: 2021-04-13 | Disposition: A | Discharge status: Home / Self Care | Payer: Medicare Other | Source: Ambulatory Visit | Attending: Internal Medicine | Admitting: Internal Medicine

## 2021-04-13 ENCOUNTER — Other Ambulatory Visit: Payer: Self-pay

## 2021-04-13 DIAGNOSIS — R1013 Epigastric pain: Secondary | ICD-10-CM | POA: Diagnosis not present

## 2021-04-13 DIAGNOSIS — R112 Nausea with vomiting, unspecified: Secondary | ICD-10-CM | POA: Diagnosis not present

## 2021-04-13 DIAGNOSIS — K8020 Calculus of gallbladder without cholecystitis without obstruction: Secondary | ICD-10-CM | POA: Insufficient documentation

## 2021-04-13 MED ORDER — TECHNETIUM TC 99M MEBROFENIN IV KIT
5.0000 | PACK | Freq: Once | INTRAVENOUS | Status: AC | PRN
  Administered 2021-04-13: 5.3 via INTRAVENOUS

## 2021-04-17 ENCOUNTER — Other Ambulatory Visit (HOSPITAL_COMMUNITY): Payer: Self-pay | Admitting: Family Medicine

## 2021-04-17 DIAGNOSIS — R112 Nausea with vomiting, unspecified: Secondary | ICD-10-CM

## 2021-04-23 ENCOUNTER — Ambulatory Visit (HOSPITAL_COMMUNITY)
Admission: RE | Admit: 2021-04-23 | Discharge: 2021-04-23 | Disposition: A | Discharge status: Home / Self Care | Payer: Medicare Other | Source: Ambulatory Visit | Attending: Family Medicine | Admitting: Family Medicine

## 2021-04-23 DIAGNOSIS — K760 Fatty (change of) liver, not elsewhere classified: Secondary | ICD-10-CM | POA: Diagnosis not present

## 2021-04-23 DIAGNOSIS — R112 Nausea with vomiting, unspecified: Secondary | ICD-10-CM | POA: Insufficient documentation

## 2021-04-23 DIAGNOSIS — R1110 Vomiting, unspecified: Secondary | ICD-10-CM | POA: Diagnosis not present

## 2021-04-23 MED ORDER — SODIUM CHLORIDE (PF) 0.9 % IJ SOLN
INTRAMUSCULAR | Status: AC
  Filled 2021-04-23: qty 50

## 2021-04-23 MED ORDER — IOHEXOL 300 MG/ML  SOLN
100.0000 mL | Freq: Once | INTRAMUSCULAR | Status: AC | PRN
  Administered 2021-04-23: 100 mL via INTRAVENOUS

## 2021-04-24 ENCOUNTER — Encounter: Payer: Self-pay | Admitting: Internal Medicine

## 2021-04-25 ENCOUNTER — Encounter (HOSPITAL_COMMUNITY): Payer: Self-pay | Admitting: Radiology

## 2021-06-11 ENCOUNTER — Encounter: Payer: Self-pay | Admitting: Adult Health

## 2021-06-11 ENCOUNTER — Ambulatory Visit: Payer: Medicare Other | Admitting: Adult Health

## 2021-06-11 VITALS — BP 149/98 | HR 76 | Ht 59.0 in | Wt 163.2 lb

## 2021-06-11 DIAGNOSIS — Z9989 Dependence on other enabling machines and devices: Secondary | ICD-10-CM | POA: Diagnosis not present

## 2021-06-11 DIAGNOSIS — G4733 Obstructive sleep apnea (adult) (pediatric): Secondary | ICD-10-CM

## 2021-06-11 NOTE — Progress Notes (Signed)
PATIENT: Janice Hamilton DOB: 1950-05-01  REASON FOR VISIT: follow up HISTORY FROM: patient  Chief Complaint  Patient presents with   Follow-up    Pt in 71   pt is here CPAP follow up  pt states she has not been using CPAP has much . Pt states when she wears mask she has neck pain,headaches and a small knot is on back of neck      HISTORY OF PRESENT ILLNESS: Today 06/11/21:  Janice Hamilton is a 71 year old female with a history of obstructive sleep apnea on CPAP.  She returns today for follow-up.Reports having neck pain. Wakes up and can hardly move her head. Already taking flexeril at bedtime- reports that he helps some. Reports that she doesn't have neck pain when she doesn't use the CPAP. Not sure if its the straps? She continue to smoke cigarettes- 1 pack every other day.    06/12/20: Janice Hamilton is a 71 year old female with a history of obstructive sleep apnea on CPAP.  She returns today for follow-up.  She reports that there are some nights that she takes her machine off without knowing.  She also reports that some nights she deals with allergies and a headache and cannot wear it for very long.  She does find it beneficial.  Denies the mask leaking.  Her download is below.    HISTORY 10/27/2017: I have the pleasure to meet with Janice Hamilton today, who has remained a very highly compliant CPAP user.  The patient has 100% use at compliance 4 days, and 97% compliance 4 hours.  He averages 6 hours and 35 minutes each night.  Her residual AHI is 1.1/h, in spite of rather high air leaks there is excellent control of her apnea.  Set pressure is 11 cmH2O with 3 cm EPR, her medication list still includes Celexa, Flexeril, Ativan and Zyrtec.  Her fatigue severity score was endorsed at 18 points and her Epworth sleepiness score was endorsed at 10 points out of 24.  She endorsed only 2 points out of 15 and the geriatric depression score. She continues to smoke. She has failed to quit - smokes 15  cig.  a day.     REVIEW OF SYSTEMS: Out of a complete 14 system review of symptoms, the patient complains only of the following symptoms, and all other reviewed systems are negative.  FSS 23 ESS 12  ALLERGIES: No Known Allergies  HOME MEDICATIONS: Outpatient Medications Prior to Visit  Medication Sig Dispense Refill   citalopram (CELEXA) 20 MG tablet Take 1 tablet (20 mg total) by mouth daily. 90 tablet 3   cyclobenzaprine (FLEXERIL) 10 MG tablet Take 0.5-1 tablets (5-10 mg total) by mouth 3 (three) times daily as needed for muscle spasms. Do not mix with narcotics. May cause drowsiness. 270 tablet 3   EQ ALLERGY RELIEF, CETIRIZINE, 10 MG tablet Take 1 tablet by mouth once daily 90 tablet 0   Ibuprofen (ADVIL PO) Take by mouth as needed.     LORazepam (ATIVAN) 0.5 MG tablet Take 0.5-1 tablets (0.25-0.5 mg total) by mouth daily as needed for anxiety. 30 tablet 1   pantoprazole (PROTONIX) 40 MG tablet Take 1 tablet (40 mg total) by mouth daily. 90 tablet 3   rosuvastatin (CRESTOR) 5 MG tablet Take 1 tablet (5 mg total) by mouth daily. 90 tablet 3   No facility-administered medications prior to visit.    PAST MEDICAL HISTORY: Past Medical History:  Diagnosis Date   Allergy  Anxiety    Blood transfusion without reported diagnosis    Depression    Hypertension     PAST SURGICAL HISTORY: Past Surgical History:  Procedure Laterality Date   ABDOMINAL HYSTERECTOMY      FAMILY HISTORY: Family History  Problem Relation Age of Onset   Diabetes Mother    Stroke Father    Hypothyroidism Sister    Sleep apnea Neg Hx     SOCIAL HISTORY: Social History   Socioeconomic History   Marital status: Divorced    Spouse name: Not on file   Number of children: 1   Years of education: Not on file   Highest education level: High school graduate  Occupational History   Not on file  Tobacco Use   Smoking status: Every Day    Packs/day: 0.50    Types: Cigarettes   Smokeless tobacco:  Never  Vaping Use   Vaping Use: Never used  Substance and Sexual Activity   Alcohol use: No   Drug use: No   Sexual activity: Not on file  Other Topics Concern   Not on file  Social History Narrative   Not on file   Social Determinants of Health   Financial Resource Strain: Not on file  Food Insecurity: Not on file  Transportation Needs: Not on file  Physical Activity: Not on file  Stress: Not on file  Social Connections: Not on file  Intimate Partner Violence: Not on file      PHYSICAL EXAM  Vitals:   06/11/21 0858  BP: (!) 149/98  Pulse: 76  Weight: 163 lb 3.2 oz (74 kg)  Height: '4\' 11"'$  (1.499 m)   Body mass index is 32.96 kg/m.  Generalized: Well developed, in no acute distress  Chest: Lungs clear to auscultation bilaterally  Neurological examination  Mentation: Alert oriented to time, place, history taking. Follows all commands speech and language fluent Cranial nerve II-XII: Extraocular movements were full, visual field were full on confrontational test Head turning and shoulder shrug  were normal and symmetric. Motor: The motor testing reveals 5 over 5 strength of all 4 extremities. Good symmetric motor tone is noted throughout.  Sensory: Sensory testing is intact to soft touch on all 4 extremities. No evidence of extinction is noted.  Gait and station: Gait is normal.    DIAGNOSTIC DATA (LABS, IMAGING, TESTING) - I reviewed patient records, labs, notes, testing and imaging myself where available.      Component Value Date/Time   NA 141 04/25/2017 1220   K 4.8 04/25/2017 1220   CL 101 04/25/2017 1220   CO2 22 04/25/2017 1220   GLUCOSE 93 04/25/2017 1220   BUN 15 04/25/2017 1220   CREATININE 1.02 (H) 04/25/2017 1220   CALCIUM 10.4 (H) 04/25/2017 1220   ALBUMIN 4.8 04/25/2017 1220   GFRNONAA 57 (L) 04/25/2017 1220   GFRAA 66 04/25/2017 1220   Lab Results  Component Value Date   CHOL 265 (H) 08/08/2017   HDL 39 (L) 08/08/2017   LDLCALC 179 (H)  08/08/2017   TRIG 236 (H) 08/08/2017   CHOLHDL 6.8 (H) 08/08/2017   Lab Results  Component Value Date   HGBA1C 6.2 (H) 04/25/2017   No results found for: [id number] Lab Results  Component Value Date   TSH 2.320 08/08/2017      ASSESSMENT AND PLAN 71 y.o. year old female  has a past medical history of Allergy, Anxiety, Blood transfusion without reported diagnosis, Depression, and Hypertension. here with:  OSA on  CPAP  - CPAP compliance suboptimal - Good treatment of AHI when she using the machine - Encourage patient to use CPAP nightly and > 4 hours each night - mask refitting ordered  - F/U in 1 year or sooner if needed    Ward Givens, MSN, NP-C 06/11/2021, 9:12 AM [facility] [address] [place], [place] [address] [phone]

## 2021-06-11 NOTE — Patient Instructions (Addendum)
Continue using CPAP nightly and greater than 4 hours each night Mask refitting ordered If your symptoms worsen or you develop new symptoms please let us know.   

## 2021-06-12 NOTE — Progress Notes (Signed)
Denyse Amass, RN; Vanessa Ralphs got it      Previous Messages   ----- Message -----  From: Brandon Melnick, RN  Sent: 06/11/2021  10:56 AM EDT  To: [name]   Good Morning.   New order in Epic mask refitting : current mask causing neck pain?    Janice Hamilton  Female, 71 y.o., Jun 01, 1950  MRN:  [id number]   Thanks   Lovey Newcomer RN

## 2021-07-02 ENCOUNTER — Ambulatory Visit: Payer: Medicare Other | Admitting: Gastroenterology

## 2021-07-16 ENCOUNTER — Encounter: Payer: Self-pay | Admitting: Gastroenterology

## 2021-07-16 ENCOUNTER — Ambulatory Visit (INDEPENDENT_AMBULATORY_CARE_PROVIDER_SITE_OTHER): Payer: Medicare Other | Admitting: Gastroenterology

## 2021-07-16 VITALS — BP 155/92 | HR 87 | Temp 97.6°F | Ht 59.0 in | Wt 161.2 lb

## 2021-07-16 DIAGNOSIS — R932 Abnormal findings on diagnostic imaging of liver and biliary tract: Secondary | ICD-10-CM

## 2021-07-16 DIAGNOSIS — R198 Other specified symptoms and signs involving the digestive system and abdomen: Secondary | ICD-10-CM | POA: Insufficient documentation

## 2021-07-16 DIAGNOSIS — R112 Nausea with vomiting, unspecified: Secondary | ICD-10-CM

## 2021-07-16 DIAGNOSIS — Z86010 Personal history of colonic polyps: Secondary | ICD-10-CM | POA: Diagnosis not present

## 2021-07-17 ENCOUNTER — Telehealth: Payer: Self-pay | Admitting: *Deleted

## 2021-07-18 NOTE — Telephone Encounter (Signed)
Called pt, fast busy signal x 3. Letter mailed to have pt call.

## 2021-07-25 ENCOUNTER — Telehealth: Payer: Self-pay | Admitting: Internal Medicine

## 2021-07-25 NOTE — Telephone Encounter (Signed)
Pt returning call. [phone]

## 2021-07-25 NOTE — Telephone Encounter (Signed)
See prior note

## 2021-07-26 ENCOUNTER — Telehealth: Payer: Self-pay | Admitting: Gastroenterology

## 2021-07-26 DIAGNOSIS — R932 Abnormal findings on diagnostic imaging of liver and biliary tract: Secondary | ICD-10-CM

## 2021-07-26 MED ORDER — PEG 3350-KCL-NA BICARB-NACL 420 G PO SOLR: ORAL | 0 refills | Status: DC

## 2021-07-26 NOTE — Telephone Encounter (Signed)
Reviewed prior ultrasound and CT with radiologist, Dr. Thornton Papas.  States there is definitely different attenuation between the gallbladder fundus in the gallbladder body on recent CT.  At this point, unable to rule out underlying mass.  Recommended proceeding with MRI to further evaluate.  Courtney:  Please let patient know I reviewed her ultrasound and CT with the radiologist.  They recommended that we proceed with an MRI to further evaluate the abnormalities in her gallbladder.   Mindy:  Please arrange MRI abdomen with and without contrast ASAP.  Dx: Abnormal gallbladder, soft tissue nodularity in the region of the gallbladder fundus, rule out underlying mass.

## 2021-07-26 NOTE — Telephone Encounter (Signed)
Pt returned call. She has been scheduled for 8/4 at 9:15am. Aware will mail prep instructions. Rx sent to pharmacy   PA done via [facility]. Auth# [id number], DOS: Aug 24, 2021 - Nov 22, 2021

## 2021-07-26 NOTE — Addendum Note (Signed)
Addended by: Cheron Every on: 07/26/2021 04:49 PM   Modules accepted: Orders

## 2021-07-26 NOTE — Telephone Encounter (Signed)
LMOVM to call back 

## 2021-07-27 NOTE — Telephone Encounter (Signed)
LMOVM to call back to give MRI appt details scheduled for 7/19, arrival 10:30am, npo 4 hrs prior.

## 2021-07-27 NOTE — Telephone Encounter (Signed)
LMOM for pt to call office  

## 2021-07-27 NOTE — Addendum Note (Signed)
Addended by: Cheron Every on: 07/27/2021 09:14 AM   Modules accepted: Orders

## 2021-07-30 NOTE — Telephone Encounter (Signed)
LMOVM for pt 

## 2021-07-31 NOTE — Telephone Encounter (Signed)
Yes did call alternative contact and he was supposed to advise her to call us. I tried him again and he said he will give her the message again

## 2021-07-31 NOTE — Telephone Encounter (Signed)
Noted. Does she have an alternant contact person we can try to reach to see if they can help [place] get in touch with patient? If this has already been tried, please mail letter.

## 2021-07-31 NOTE — Telephone Encounter (Signed)
LMOVM for pt. FYI to [facility] unable to reach pt

## 2021-07-31 NOTE — Telephone Encounter (Signed)
Pt returned call. Aware of MRI appt details. She voiced understanding

## 2021-08-06 DIAGNOSIS — E782 Mixed hyperlipidemia: Secondary | ICD-10-CM | POA: Diagnosis not present

## 2021-08-06 DIAGNOSIS — E1165 Type 2 diabetes mellitus with hyperglycemia: Secondary | ICD-10-CM | POA: Diagnosis not present

## 2021-08-08 ENCOUNTER — Ambulatory Visit (HOSPITAL_COMMUNITY)
Admission: RE | Admit: 2021-08-08 | Discharge: 2021-08-08 | Disposition: A | Discharge status: Home / Self Care | Payer: Medicare Other | Source: Ambulatory Visit | Attending: Gastroenterology | Admitting: Gastroenterology

## 2021-08-08 ENCOUNTER — Other Ambulatory Visit: Payer: Self-pay | Admitting: Gastroenterology

## 2021-08-08 DIAGNOSIS — R935 Abnormal findings on diagnostic imaging of other abdominal regions, including retroperitoneum: Secondary | ICD-10-CM | POA: Diagnosis not present

## 2021-08-08 DIAGNOSIS — R932 Abnormal findings on diagnostic imaging of liver and biliary tract: Secondary | ICD-10-CM | POA: Diagnosis not present

## 2021-08-08 DIAGNOSIS — K828 Other specified diseases of gallbladder: Secondary | ICD-10-CM | POA: Diagnosis not present

## 2021-08-08 DIAGNOSIS — N281 Cyst of kidney, acquired: Secondary | ICD-10-CM | POA: Diagnosis not present

## 2021-08-08 DIAGNOSIS — K760 Fatty (change of) liver, not elsewhere classified: Secondary | ICD-10-CM | POA: Diagnosis not present

## 2021-08-08 MED ORDER — GADOBUTROL 1 MMOL/ML IV SOLN
7.5000 mL | Freq: Once | INTRAVENOUS | Status: AC | PRN
  Administered 2021-08-08: 7.5 mL via INTRAVENOUS

## 2021-08-09 ENCOUNTER — Other Ambulatory Visit: Payer: Self-pay | Admitting: *Deleted

## 2021-08-09 DIAGNOSIS — R932 Abnormal findings on diagnostic imaging of liver and biliary tract: Secondary | ICD-10-CM

## 2021-08-13 DIAGNOSIS — N1831 Chronic kidney disease, stage 3a: Secondary | ICD-10-CM | POA: Diagnosis not present

## 2021-08-13 DIAGNOSIS — R110 Nausea: Secondary | ICD-10-CM | POA: Diagnosis not present

## 2021-08-13 DIAGNOSIS — F17200 Nicotine dependence, unspecified, uncomplicated: Secondary | ICD-10-CM | POA: Diagnosis not present

## 2021-08-13 DIAGNOSIS — K219 Gastro-esophageal reflux disease without esophagitis: Secondary | ICD-10-CM | POA: Diagnosis not present

## 2021-08-13 DIAGNOSIS — M1990 Unspecified osteoarthritis, unspecified site: Secondary | ICD-10-CM | POA: Diagnosis not present

## 2021-08-13 DIAGNOSIS — E1165 Type 2 diabetes mellitus with hyperglycemia: Secondary | ICD-10-CM | POA: Diagnosis not present

## 2021-08-13 DIAGNOSIS — G4733 Obstructive sleep apnea (adult) (pediatric): Secondary | ICD-10-CM | POA: Diagnosis not present

## 2021-08-13 DIAGNOSIS — E782 Mixed hyperlipidemia: Secondary | ICD-10-CM | POA: Diagnosis not present

## 2021-08-13 DIAGNOSIS — K649 Unspecified hemorrhoids: Secondary | ICD-10-CM | POA: Diagnosis not present

## 2021-08-13 DIAGNOSIS — M810 Age-related osteoporosis without current pathological fracture: Secondary | ICD-10-CM | POA: Diagnosis not present

## 2021-08-20 ENCOUNTER — Telehealth: Payer: Self-pay | Admitting: *Deleted

## 2021-08-20 NOTE — Telephone Encounter (Signed)
Called pt, LMOVM to call back to reschedule procedure for 8/4

## 2021-08-21 NOTE — Telephone Encounter (Signed)
LMOVM to call back 

## 2021-08-22 NOTE — Telephone Encounter (Signed)
Pt called. She needed to cancel procedure for now. She will call back to reschedule.

## 2021-08-24 ENCOUNTER — Ambulatory Visit (HOSPITAL_COMMUNITY): Admission: RE | Admit: 2021-08-24 | Payer: Medicare Other | Source: Home / Self Care

## 2021-08-24 ENCOUNTER — Encounter (HOSPITAL_COMMUNITY): Admission: RE | Payer: Self-pay | Source: Home / Self Care

## 2021-08-24 SURGERY — COLONOSCOPY WITH PROPOFOL
Anesthesia: Monitor Anesthesia Care

## 2021-09-03 ENCOUNTER — Encounter: Payer: Self-pay | Admitting: *Deleted

## 2021-09-03 NOTE — Telephone Encounter (Signed)
Patient called back to schedule colonoscopy/egd for 10/09/21 at 8:00 am. New instructions, printed and mailed to pt.

## 2021-09-12 DIAGNOSIS — G4733 Obstructive sleep apnea (adult) (pediatric): Secondary | ICD-10-CM | POA: Diagnosis not present

## 2021-10-05 ENCOUNTER — Encounter (HOSPITAL_COMMUNITY)
Admission: RE | Admit: 2021-10-05 | Discharge: 2021-10-05 | Disposition: A | Discharge status: Home / Self Care | Payer: Medicare Other | Source: Ambulatory Visit | Attending: Internal Medicine | Admitting: Internal Medicine

## 2021-10-05 ENCOUNTER — Encounter (HOSPITAL_COMMUNITY): Payer: Self-pay

## 2021-10-05 HISTORY — DX: Sleep apnea, unspecified: G47.30

## 2021-10-05 HISTORY — DX: Type 2 diabetes mellitus without complications: E11.9

## 2021-10-09 ENCOUNTER — Encounter (HOSPITAL_COMMUNITY)
Admission: RE | Disposition: A | Discharge status: Home / Self Care | Payer: Self-pay | Source: Home / Self Care | Attending: Internal Medicine

## 2021-10-09 ENCOUNTER — Encounter (HOSPITAL_COMMUNITY): Payer: Self-pay

## 2021-10-09 ENCOUNTER — Ambulatory Visit (HOSPITAL_COMMUNITY): Payer: Medicare Other | Admitting: Certified Registered"

## 2021-10-09 ENCOUNTER — Ambulatory Visit (HOSPITAL_COMMUNITY)
Admission: RE | Admit: 2021-10-09 | Discharge: 2021-10-09 | Disposition: A | Discharge status: Home / Self Care | Payer: Medicare Other | Attending: Internal Medicine | Admitting: Internal Medicine

## 2021-10-09 ENCOUNTER — Other Ambulatory Visit: Payer: Self-pay | Admitting: *Deleted

## 2021-10-09 ENCOUNTER — Telehealth: Payer: Self-pay | Admitting: Internal Medicine

## 2021-10-09 ENCOUNTER — Ambulatory Visit (HOSPITAL_BASED_OUTPATIENT_CLINIC_OR_DEPARTMENT_OTHER): Payer: Medicare Other | Admitting: Certified Registered"

## 2021-10-09 DIAGNOSIS — D122 Benign neoplasm of ascending colon: Secondary | ICD-10-CM

## 2021-10-09 DIAGNOSIS — Z1211 Encounter for screening for malignant neoplasm of colon: Secondary | ICD-10-CM | POA: Insufficient documentation

## 2021-10-09 DIAGNOSIS — E119 Type 2 diabetes mellitus without complications: Secondary | ICD-10-CM | POA: Insufficient documentation

## 2021-10-09 DIAGNOSIS — D124 Benign neoplasm of descending colon: Secondary | ICD-10-CM | POA: Insufficient documentation

## 2021-10-09 DIAGNOSIS — K449 Diaphragmatic hernia without obstruction or gangrene: Secondary | ICD-10-CM | POA: Insufficient documentation

## 2021-10-09 DIAGNOSIS — K2950 Unspecified chronic gastritis without bleeding: Secondary | ICD-10-CM | POA: Diagnosis not present

## 2021-10-09 DIAGNOSIS — K648 Other hemorrhoids: Secondary | ICD-10-CM | POA: Diagnosis not present

## 2021-10-09 DIAGNOSIS — K2980 Duodenitis without bleeding: Secondary | ICD-10-CM | POA: Insufficient documentation

## 2021-10-09 DIAGNOSIS — D123 Benign neoplasm of transverse colon: Secondary | ICD-10-CM | POA: Insufficient documentation

## 2021-10-09 DIAGNOSIS — Z09 Encounter for follow-up examination after completed treatment for conditions other than malignant neoplasm: Secondary | ICD-10-CM

## 2021-10-09 DIAGNOSIS — I10 Essential (primary) hypertension: Secondary | ICD-10-CM | POA: Diagnosis not present

## 2021-10-09 DIAGNOSIS — F17210 Nicotine dependence, cigarettes, uncomplicated: Secondary | ICD-10-CM | POA: Insufficient documentation

## 2021-10-09 DIAGNOSIS — Z7984 Long term (current) use of oral hypoglycemic drugs: Secondary | ICD-10-CM | POA: Diagnosis not present

## 2021-10-09 DIAGNOSIS — K222 Esophageal obstruction: Secondary | ICD-10-CM

## 2021-10-09 DIAGNOSIS — G4730 Sleep apnea, unspecified: Secondary | ICD-10-CM | POA: Diagnosis not present

## 2021-10-09 DIAGNOSIS — K5730 Diverticulosis of large intestine without perforation or abscess without bleeding: Secondary | ICD-10-CM | POA: Diagnosis not present

## 2021-10-09 DIAGNOSIS — K317 Polyp of stomach and duodenum: Secondary | ICD-10-CM

## 2021-10-09 DIAGNOSIS — Z86010 Personal history of colonic polyps: Secondary | ICD-10-CM | POA: Insufficient documentation

## 2021-10-09 DIAGNOSIS — Z833 Family history of diabetes mellitus: Secondary | ICD-10-CM | POA: Insufficient documentation

## 2021-10-09 DIAGNOSIS — A048 Other specified bacterial intestinal infections: Secondary | ICD-10-CM | POA: Diagnosis not present

## 2021-10-09 DIAGNOSIS — F32A Depression, unspecified: Secondary | ICD-10-CM | POA: Diagnosis not present

## 2021-10-09 DIAGNOSIS — R932 Abnormal findings on diagnostic imaging of liver and biliary tract: Secondary | ICD-10-CM

## 2021-10-09 DIAGNOSIS — K2970 Gastritis, unspecified, without bleeding: Secondary | ICD-10-CM | POA: Diagnosis not present

## 2021-10-09 DIAGNOSIS — F419 Anxiety disorder, unspecified: Secondary | ICD-10-CM | POA: Diagnosis not present

## 2021-10-09 DIAGNOSIS — D120 Benign neoplasm of cecum: Secondary | ICD-10-CM | POA: Diagnosis not present

## 2021-10-09 DIAGNOSIS — R112 Nausea with vomiting, unspecified: Secondary | ICD-10-CM | POA: Diagnosis present

## 2021-10-09 DIAGNOSIS — K3189 Other diseases of stomach and duodenum: Secondary | ICD-10-CM | POA: Diagnosis not present

## 2021-10-09 DIAGNOSIS — K635 Polyp of colon: Secondary | ICD-10-CM | POA: Insufficient documentation

## 2021-10-09 DIAGNOSIS — K2990 Gastroduodenitis, unspecified, without bleeding: Secondary | ICD-10-CM | POA: Diagnosis not present

## 2021-10-09 HISTORY — PX: BIOPSY: SHX5522

## 2021-10-09 HISTORY — PX: POLYPECTOMY: SHX5525

## 2021-10-09 HISTORY — PX: COLONOSCOPY WITH PROPOFOL: SHX5780

## 2021-10-09 HISTORY — PX: ESOPHAGOGASTRODUODENOSCOPY (EGD) WITH PROPOFOL: SHX5813

## 2021-10-09 LAB — GLUCOSE, CAPILLARY: Glucose-Capillary: 114 mg/dL — ABNORMAL HIGH (ref 70–99)

## 2021-10-09 SURGERY — COLONOSCOPY WITH PROPOFOL
Anesthesia: General

## 2021-10-09 MED ORDER — PROPOFOL 10 MG/ML IV BOLUS
INTRAVENOUS | Status: DC | PRN
  Administered 2021-10-09: 20 mg via INTRAVENOUS
  Administered 2021-10-09: 70 mg via INTRAVENOUS

## 2021-10-09 MED ORDER — LIDOCAINE HCL (CARDIAC) PF 100 MG/5ML IV SOSY
PREFILLED_SYRINGE | INTRAVENOUS | Status: DC | PRN
  Administered 2021-10-09: 50 mg via INTRATRACHEAL

## 2021-10-09 MED ORDER — PROPOFOL 500 MG/50ML IV EMUL
INTRAVENOUS | Status: DC | PRN
  Administered 2021-10-09: 200 ug/kg/min via INTRAVENOUS

## 2021-10-09 MED ORDER — LACTATED RINGERS IV SOLN: INTRAVENOUS | Status: DC

## 2021-10-09 NOTE — Op Note (Addendum)
[facility] Patient Name: Janice Hamilton Procedure Date: 10/09/2021 8:10 AM MRN: [id number] Date of Birth: 1950/02/03 Attending MD: Elon Alas. Abbey Chatters DO CSN: [id number] Age: 71 Admit Type: Outpatient Procedure:                Colonoscopy Indications:              Surveillance: Personal history of adenomatous                            polyps on last colonoscopy > 3 years ago Providers:                Elon Alas. Abbey Chatters, DO, Lambert Mody, Caprice Kluver, [place] Gene Technician, Technician Referring MD:             Elon Alas. Abbey Chatters, DO Medicines:                See the Anesthesia note for documentation of the                            administered medications Complications:            No immediate complications. Estimated Blood Loss:     Estimated blood loss was minimal. Procedure:                Pre-Anesthesia Assessment:                           - The anesthesia plan was to use monitored                            anesthesia care (MAC).                           After obtaining informed consent, the colonoscope                            was passed under direct vision. Throughout the                            procedure, the patient's blood pressure, pulse, and                            oxygen saturations were monitored continuously. The                            [id number]-HQ190L ([id number]) scope was introduced through                            the anus and advanced to the the cecum, identified                            by appendiceal orifice and ileocecal valve. The  colonoscopy was performed without difficulty. The                            patient tolerated the procedure well. The quality                            of the bowel preparation was evaluated using the                            BBPS [facility] Bowel Preparation Scale) with scores                            of: Right Colon = 3, Transverse Colon = 3 and Left                             Colon = 3 (entire mucosa seen well with no residual                            staining, small fragments of stool or opaque                            liquid). The total BBPS score equals 9. Scope In: 8:30:25 AM Scope Out: 8:57:42 AM Scope Withdrawal Time: 0 hours 24 minutes 21 seconds  Total Procedure Duration: 0 hours 27 minutes 17 seconds  Findings:      Non-bleeding internal hemorrhoids were found during endoscopy.      Multiple small and large-mouthed diverticula were found in the sigmoid       colon.      A 2 mm polyp was found in the cecum. The polyp was sessile. The polyp       was removed with a cold biopsy forceps. Resection and retrieval were       complete.      Two sessile polyps were found in the ascending colon. The polyps were 4       to 6 mm in size. These polyps were removed with a cold snare. Resection       and retrieval were complete.      Seven sessile polyps were found in the transverse colon. The polyps were       4 to 8 mm in size. These polyps were removed with a cold snare.       Resection and retrieval were complete.      Six sessile polyps were found in the descending colon. The polyps were 4       to 8 mm in size. These polyps were removed with a cold snare. Resection       and retrieval were complete.      11 sessile polyps were found in the sigmoid colon. The polyps were 5 to       10 mm in size. These polyps were removed with a cold snare. Resection       and retrieval were complete.      Hemorrhoids were found on perianal exam. Impression:               - Non-bleeding internal hemorrhoids.                           -  Diverticulosis in the sigmoid colon.                           - One 2 mm polyp in the cecum, removed with a cold                            biopsy forceps. Resected and retrieved.                           - Two 4 to 6 mm polyps in the ascending colon,                            removed with a cold snare. Resected and  retrieved.                           - Seven 4 to 8 mm polyps in the transverse colon,                            removed with a cold snare. Resected and retrieved.                           - Six 4 to 8 mm polyps in the descending colon,                            removed with a cold snare. Resected and retrieved.                           - 11 5 to 10 mm polyps in the sigmoid colon,                            removed with a cold snare. Resected and retrieved.                           - Hemorrhoids found on perianal exam. Moderate Sedation:      Per Anesthesia Care Recommendation:           - Patient has a contact number available for                            emergencies. The signs and symptoms of potential                            delayed complications were discussed with the                            patient. Return to normal activities tomorrow.                            Written discharge instructions were provided to the                            patient.                           -  Resume previous diet.                           - Continue present medications.                           - Await pathology results.                           - Repeat colonoscopy in 1 year for surveillance.                           - Return to GI clinic in 3 months.                           - 27 polyps removed Procedure Code(s):        --- Professional ---                           [phone], Colonoscopy, flexible; with removal of                            tumor(s), polyp(s), or other lesion(s) by snare                            technique                           [id number], 59, Colonoscopy, flexible; with biopsy,                            single or multiple Diagnosis Code(s):        --- Professional ---                           K63.5, Polyp of colon                           Z86.010, Personal history of colonic polyps                           K64.8, Other hemorrhoids                            K57.30, Diverticulosis of large intestine without                            perforation or abscess without bleeding CPT copyright 2019 American Medical Association. All rights reserved. The codes documented in this report are preliminary and upon coder review may  be revised to meet current compliance requirements. Elon Alas. Abbey Chatters, DO [place] Abbey Chatters, DO 10/09/2021 9:01:27 AM This report has been signed electronically. Number of Addenda: 0

## 2021-10-09 NOTE — Telephone Encounter (Signed)
Good morning, just finished EGD colonoscopy on this patient.  Upon further chart review looks like we referred her to general surgery to discuss cholecystectomy given her abnormal gallbladder MRI.  She states she has not met with surgery yet.  Not sure if she just missed her appointment or what?  Regardless we will need to refer her to general surgery.  Patient states she is agreeable.  [employer] as an [place]

## 2021-10-09 NOTE — Anesthesia Preprocedure Evaluation (Signed)
Anesthesia Evaluation  Patient identified by MRN, date of birth, ID band Patient awake    Reviewed: Allergy & Precautions, H&P , NPO status , Patient's Chart, lab work & pertinent test results, reviewed documented beta blocker date and time   Airway Mallampati: II  TM Distance: >3 FB Neck ROM: full    Dental no notable dental hx.    Pulmonary sleep apnea , Current Smoker and Patient abstained from smoking.,    Pulmonary exam normal breath sounds clear to auscultation       Cardiovascular Exercise Tolerance: Good hypertension, negative cardio ROS   Rhythm:regular Rate:Normal     Neuro/Psych PSYCHIATRIC DISORDERS Anxiety Depression negative neurological ROS     GI/Hepatic negative GI ROS, Neg liver ROS,   Endo/Other  negative endocrine ROSdiabetes  Renal/GU negative Renal ROS  negative genitourinary   Musculoskeletal   Abdominal   Peds  Hematology negative hematology ROS (+)   Anesthesia Other Findings   Reproductive/Obstetrics negative OB ROS                             Anesthesia Physical Anesthesia Plan  ASA: 2  Anesthesia Plan: General   Post-op Pain Management:    Induction:   PONV Risk Score and Plan: Propofol infusion  Airway Management Planned:   Additional Equipment:   Intra-op Plan:   Post-operative Plan:   Informed Consent: I have reviewed the patients History and Physical, chart, labs and discussed the procedure including the risks, benefits and alternatives for the proposed anesthesia with the patient or authorized representative who has indicated his/her understanding and acceptance.     Dental Advisory Given  Plan Discussed with: CRNA  Anesthesia Plan Comments:         Anesthesia Quick Evaluation

## 2021-10-09 NOTE — Anesthesia Postprocedure Evaluation (Signed)
Anesthesia Post Note  Patient: Janice Hamilton  Procedure(s) Performed: COLONOSCOPY WITH PROPOFOL ESOPHAGOGASTRODUODENOSCOPY (EGD) WITH PROPOFOL BIOPSY POLYPECTOMY  Patient location during evaluation: Phase II Anesthesia Type: General Level of consciousness: awake Pain management: pain level controlled Vital Signs Assessment: post-procedure vital signs reviewed and stable Respiratory status: spontaneous breathing and respiratory function stable Cardiovascular status: blood pressure returned to baseline and stable Postop Assessment: no headache and no apparent nausea or vomiting Anesthetic complications: no Comments: Late entry   No notable events documented.   Last Vitals:  Vitals:   10/09/21 0910 10/09/21 0915  BP:    Pulse: 73 73  Resp: 14 18  Temp:    SpO2: 97% 94%    Last Pain:  Vitals:   10/09/21 0904  TempSrc:   PainSc: 0-No pain                 Louann Sjogren

## 2021-10-09 NOTE — Transfer of Care (Signed)
Immediate Anesthesia Transfer of Care Note  Patient: Janice Hamilton  Procedure(s) Performed: COLONOSCOPY WITH PROPOFOL ESOPHAGOGASTRODUODENOSCOPY (EGD) WITH PROPOFOL BIOPSY POLYPECTOMY  Patient Location: PACU  Anesthesia Type:General  Level of Consciousness: awake and alert   Airway & Oxygen Therapy: Patient Spontanous Breathing and Patient connected to nasal cannula oxygen  Post-op Assessment: Report given to RN and Post -op Vital signs reviewed and stable  Post vital signs: Reviewed and stable  Last Vitals:  Vitals Value Taken Time  BP 124/73 10/09/21 0903  Temp 97.7   Pulse 80 10/09/21 0904  Resp 18 10/09/21 0904  SpO2 99 % 10/09/21 0904  Vitals shown include unvalidated device data.  Last Pain:  Vitals:   10/09/21 0814  TempSrc:   PainSc: 0-No pain      Patients Stated Pain Goal: 5 (16/10/96 0454)  Complications: No notable events documented.

## 2021-10-09 NOTE — H&P (Signed)
Primary Care Physician:  Celene Squibb, MD Primary Gastroenterologist:  Dr. Abbey Chatters  Pre-Procedure History & Physical: HPI:  Janice Hamilton is a 71 y.o. female is here for an EGD for nausea vomiting and a colonoscopy to be performed for surveillance purposes, personal history of numerous adenomatous colon polyps, high grade dysplasia in 2019.   Past Medical History:  Diagnosis Date   Allergy    Anxiety    Blood transfusion without reported diagnosis    Depression    Diabetes mellitus without complication ([place])    Hypertension    Sleep apnea     Past Surgical History:  Procedure Laterality Date   ABDOMINAL HYSTERECTOMY     COLONOSCOPY      Prior to Admission medications   Medication Sig Start Date End Date Taking? Authorizing Provider  Ascorbic Acid (VITAMIN C PO) Take 1 tablet by mouth daily.   Yes [provider]  CALCIUM PO Take 1 tablet by mouth daily.   Yes [provider]  Cholecalciferol (VITAMIN D3 PO) Take 1 tablet by mouth daily.   Yes [provider]  citalopram (CELEXA) 10 MG tablet Take 10 mg by mouth daily.   Yes [provider]  cyclobenzaprine (FLEXERIL) 10 MG tablet Take 0.5-1 tablets (5-10 mg total) by mouth 3 (three) times daily as needed for muscle spasms. Do not mix with narcotics. May cause drowsiness. Patient taking differently: Take 10 mg by mouth 3 (three) times daily. Do not mix with narcotics. May cause drowsiness. 08/08/17  Yes Tereasa Coop, PA-C  EQ ALLERGY RELIEF, CETIRIZINE, 10 MG tablet Take 1 tablet by mouth once daily 04/24/18  Yes Jacelyn Pi, Lilia Argue, MD  ibuprofen (ADVIL) 800 MG tablet Take 800 mg by mouth every 8 (eight) hours as needed for moderate pain.   Yes [provider]  LORazepam (ATIVAN) 0.5 MG tablet Take 0.5-1 tablets (0.25-0.5 mg total) by mouth daily as needed for anxiety. Patient taking differently: Take 0.25-0.5 mg by mouth See admin instructions. Take 0.5 mg by mouth in the morning and  afternoon and take 1 mg at night 08/08/17  Yes Tereasa Coop, PA-C  metFORMIN (GLUCOPHAGE-XR) 500 MG 24 hr tablet Take 500 mg by mouth daily. 06/08/21  Yes [provider]  NON FORMULARY Pt uses a cpap nightly   Yes [provider]  ondansetron (ZOFRAN-ODT) 4 MG disintegrating tablet Take 4 mg by mouth every 8 (eight) hours as needed. 04/17/21  Yes [provider]  pantoprazole (PROTONIX) 40 MG tablet Take 1 tablet (40 mg total) by mouth daily. 08/08/17  Yes Tereasa Coop, PA-C  polyethylene glycol-electrolytes (NULYTELY) 420 g solution As directed 07/26/21  Yes Hurshel Keys K, DO  rosuvastatin (CRESTOR) 10 MG tablet Take 10 mg by mouth daily. 08/03/21  Yes [provider]  citalopram (CELEXA) 20 MG tablet Take 1 tablet (20 mg total) by mouth daily. Patient not taking: Reported on 10/02/2021 08/08/17   Tereasa Coop, PA-C  rosuvastatin (CRESTOR) 5 MG tablet Take 1 tablet (5 mg total) by mouth daily. Patient not taking: Reported on 10/02/2021 08/18/17   Tereasa Coop, PA-C    Allergies as of 09/03/2021   (No Known Allergies)    Family History  Problem Relation Age of Onset   Diabetes Mother    Stroke Father    Hypothyroidism Sister    Sleep apnea Neg Hx    Colon cancer Neg Hx     Social History   Socioeconomic History  Marital status: Divorced    Spouse name: Not on file   Number of children: 1   Years of education: Not on file   Highest education level: High school graduate  Occupational History   Not on file  Tobacco Use   Smoking status: Every Day    Packs/day: 0.50    Types: Cigarettes   Smokeless tobacco: Never  Vaping Use   Vaping Use: Never used  Substance and Sexual Activity   Alcohol use: No   Drug use: No   Sexual activity: Not on file  Other Topics Concern   Not on file  Social History Narrative   Not on file   Social Determinants of Health   Financial Resource Strain: Low Risk  (12/27/2016)   Overall Financial  Resource Strain (CARDIA)    Difficulty of Paying Living Expenses: Not hard at all  Food Insecurity: No Food Insecurity (12/27/2016)   Hunger Vital Sign    Worried About Running Out of Food in the Last Year: Never true    [place] in the Last Year: Never true  Transportation Needs: No Transportation Needs (12/27/2016)   PRAPARE - Hydrologist (Medical): No    Lack of Transportation (Non-Medical): No  Physical Activity: Inactive (12/27/2016)   Exercise Vital Sign    Days of Exercise per Week: 0 days    Minutes of Exercise per Session: 0 min  Stress: No Stress Concern Present (12/27/2016)   [place]    Feeling of Stress : Not at all  Social Connections: Moderately Isolated (12/27/2016)   Social Connection and Isolation Panel [NHANES]    Frequency of Communication with Friends and Family: More than three times a week    Frequency of Social Gatherings with Friends and Family: More than three times a week    Attends Religious Services: Never    Marine scientist or Organizations: No    Attends Archivist Meetings: Never    Marital Status: Divorced  Human resources officer Violence: Not At Risk (12/27/2016)   Humiliation, Afraid, Rape, and Kick questionnaire    Fear of Current or Ex-Partner: No    Emotionally Abused: No    Physically Abused: No    Sexually Abused: No    Review of Systems: See HPI, otherwise negative ROS  Physical Exam: Vital signs in last 24 hours: Temp:  [98 F (36.7 C)] 98 F (36.7 C) (09/19 0709) Pulse Rate:  [77] 77 (09/19 0709) Resp:  [18] 18 (09/19 0709) BP: (179)/(96) 179/96 (09/19 0709) SpO2:  [98 %] 98 % (09/19 0709)   General:   Alert,  Well-developed, well-nourished, pleasant and cooperative in NAD Head:  Normocephalic and atraumatic. Eyes:  Sclera clear, no icterus.   Conjunctiva pink. Ears:  Normal auditory acuity. Nose:  No deformity,  discharge,  or lesions. Mouth:  No deformity or lesions, dentition normal. Neck:  Supple; no masses or thyromegaly. Lungs:  Clear throughout to auscultation.   No wheezes, crackles, or rhonchi. No acute distress. Heart:  Regular rate and rhythm; no murmurs, clicks, rubs,  or gallops. Abdomen:  Soft, nontender and nondistended. No masses, hepatosplenomegaly or hernias noted. Normal bowel sounds, without guarding, and without rebound.   Msk:  Symmetrical without gross deformities. Normal posture. Extremities:  Without clubbing or edema. Neurologic:  Alert and  oriented x4;  grossly normal neurologically. Skin:  Intact without significant lesions or rashes. Cervical Nodes:  No significant cervical adenopathy. Psych:  Alert and cooperative. Normal mood and affect.  Impression/Plan: Sanaai Doane is here for an EGD for nausea vomiting and a colonoscopy to be performed for surveillance purposes, personal history of numerous adenomatous colon polyps, high grade dysplasia in 2019.   The risks of the procedure including infection, bleed, or perforation as well as benefits, limitations, alternatives and imponderables have been reviewed with the patient. Questions have been answered. All parties agreeable.

## 2021-10-09 NOTE — Discharge Instructions (Addendum)
EGD Discharge instructions Please read the instructions outlined below and refer to this sheet in the next few weeks. These discharge instructions provide you with general information on caring for yourself after you leave the hospital. Your doctor may also give you specific instructions. While your treatment has been planned according to the most current medical practices available, unavoidable complications occasionally occur. If you have any problems or questions after discharge, please call your doctor. ACTIVITY You may resume your regular activity but move at a slower pace for the next 24 hours.  Take frequent rest periods for the next 24 hours.  Walking will help expel (get rid of) the air and reduce the bloated feeling in your abdomen.  No driving for 24 hours (because of the anesthesia (medicine) used during the test).  You may shower.  Do not sign any important legal documents or operate any machinery for 24 hours (because of the anesthesia used during the test).  NUTRITION Drink plenty of fluids.  You may resume your normal diet.  Begin with a light meal and progress to your normal diet.  Avoid alcoholic beverages for 24 hours or as instructed by your caregiver.  MEDICATIONS You may resume your normal medications unless your caregiver tells you otherwise.  WHAT YOU CAN EXPECT TODAY You may experience abdominal discomfort such as a feeling of fullness or "gas" pains.  FOLLOW-UP Your doctor will discuss the results of your test with you.  SEEK IMMEDIATE MEDICAL ATTENTION IF ANY OF THE FOLLOWING OCCUR: Excessive nausea (feeling sick to your stomach) and/or vomiting.  Severe abdominal pain and distention (swelling).  Trouble swallowing.  Temperature over 101 F (37.8 C).  Rectal bleeding or vomiting of blood.      Colonoscopy Discharge Instructions  Read the instructions outlined below and refer to this sheet in the next few weeks. These discharge instructions provide you  with general information on caring for yourself after you leave the hospital. Your doctor may also give you specific instructions. While your treatment has been planned according to the most current medical practices available, unavoidable complications occasionally occur.   ACTIVITY You may resume your regular activity, but move at a slower pace for the next 24 hours.  Take frequent rest periods for the next 24 hours.  Walking will help get rid of the air and reduce the bloated feeling in your belly (abdomen).  No driving for 24 hours (because of the medicine (anesthesia) used during the test).   Do not sign any important legal documents or operate any machinery for 24 hours (because of the anesthesia used during the test).  NUTRITION Drink plenty of fluids.  You may resume your normal diet as instructed by your doctor.  Begin with a light meal and progress to your normal diet. Heavy or fried foods are harder to digest and may make you feel sick to your stomach (nauseated).  Avoid alcoholic beverages for 24 hours or as instructed.  MEDICATIONS You may resume your normal medications unless your doctor tells you otherwise.  WHAT YOU CAN EXPECT TODAY Some feelings of bloating in the abdomen.  Passage of more gas than usual.  Spotting of blood in your stool or on the toilet paper.  IF YOU HAD POLYPS REMOVED DURING THE COLONOSCOPY: No aspirin products for 7 days or as instructed.  No alcohol for 7 days or as instructed.  Eat a soft diet for the next 24 hours.  FINDING OUT THE RESULTS OF YOUR TEST Not all test results  are available during your visit. If your test results are not back during the visit, make an appointment with your caregiver to find out the results. Do not assume everything is normal if you have not heard from your caregiver or the medical facility. It is important for you to follow up on all of your test results.  SEEK IMMEDIATE MEDICAL ATTENTION IF: You have more than a  spotting of blood in your stool.  Your belly is swollen (abdominal distention).  You are nauseated or vomiting.  You have a temperature over 101.  You have abdominal pain or discomfort that is severe or gets worse throughout the day.   Your EGD revealed mild amount inflammation in your stomach.  I took biopsies of this to rule out infection with a bacteria called H. pylori.  Await pathology results, my office will contact you.  We also have a small hiatal hernia and mild Schatzki's ring.  You have a polyp in your duodenum which I biopsied today.  This may need more in-depth resection pending pathology results.  Your colonoscopy revealed 27 polyp(s) which I removed successfully. Await pathology results, my office will contact you. I recommend repeating colonoscopy in 1 years for surveillance purposes.   You also have diverticulosis and internal hemorrhoids. I would recommend increasing fiber in your diet or adding OTC Benefiber/Metamucil. Be sure to drink at least 4 to 6 glasses of water daily.   Recent referral today to general surgery to discuss having your gallbladder removed.  Otherwise follow-up with GI in 3 to 4 months    office will send a card with new appointment      I hope you have a great rest of your week!  Janice Hamilton. Janice Hamilton, D.O. Gastroenterology and Hepatology [facility]

## 2021-10-09 NOTE — Telephone Encounter (Signed)
Noted  

## 2021-10-09 NOTE — Op Note (Signed)
[facility] Patient Name: Janice Hamilton Procedure Date: 10/09/2021 8:08 AM MRN: [id number] Date of Birth: 1950/10/05 Attending MD: Elon Alas. Abbey Chatters DO CSN: [id number] Age: 71 Admit Type: Outpatient Procedure:                Upper GI endoscopy Indications:              Nausea with vomiting Providers:                Elon Alas. Abbey Chatters, DO, Lambert Mody, Caprice Kluver, Raphael Gibney, Technician, Ladoris Gene                            Technician, Technician Referring MD:             Elon Alas. Abbey Chatters, DO Medicines:                See the Anesthesia note for documentation of the                            administered medications Complications:            No immediate complications. Estimated Blood Loss:     Estimated blood loss was minimal. Procedure:                Pre-Anesthesia Assessment:                           - The anesthesia plan was to use monitored                            anesthesia care (MAC).                           After obtaining informed consent, the endoscope was                            passed under direct vision. Throughout the                            procedure, the patient's blood pressure, pulse, and                            oxygen saturations were monitored continuously. The                            [id number]-H190 ([id number]) scope was introduced through the                            mouth, and advanced to the second part of duodenum.                            The upper GI endoscopy was accomplished without                            difficulty. The patient  tolerated the procedure                            well. Scope In: 8:19:19 AM Scope Out: 8:24:51 AM Total Procedure Duration: 0 hours 5 minutes 32 seconds  Findings:      A 2 cm hiatal hernia was present.      A mild Schatzki ring was found in the distal esophagus.      Patchy mild inflammation characterized by erythema was found in the       gastric body and in  the gastric antrum. Biopsies were taken with a cold       forceps for Helicobacter pylori testing.      A single elongated 10-15 mm sessile polyp with no bleeding was found in       the first portion of the duodenum. Does not appear to involve ampulla.       Biopsies were taken with a cold forceps for histology. Impression:               - 2 cm hiatal hernia.                           - Mild Schatzki ring.                           - Gastritis. Biopsied.                           - A single duodenal polyp. Biopsied. Moderate Sedation:      Per Anesthesia Care Recommendation:           - Patient has a contact number available for                            emergencies. The signs and symptoms of potential                            delayed complications were discussed with the                            patient. Return to normal activities tomorrow.                            Written discharge instructions were provided to the                            patient.                           - Resume previous diet.                           - Continue present medications.                           - Await pathology results.                           - May need EMR of duodenal polyp pending  biopsy                            results. Procedure Code(s):        --- Professional ---                           [phone], Esophagogastroduodenoscopy, flexible,                            transoral; with biopsy, single or multiple Diagnosis Code(s):        --- Professional ---                           K44.9, Diaphragmatic hernia without obstruction or                            gangrene                           K22.2, Esophageal obstruction                           K29.70, Gastritis, unspecified, without bleeding                           K31.7, Polyp of stomach and duodenum                           R11.2, Nausea with vomiting, unspecified CPT copyright 2019 American Medical Association. All rights  reserved. The codes documented in this report are preliminary and upon coder review may  be revised to meet current compliance requirements. Elon Alas. Abbey Chatters, DO Herminie Abbey Chatters, DO 10/09/2021 8:28:33 AM This report has been signed electronically. Number of Addenda: 0

## 2021-10-09 NOTE — Telephone Encounter (Signed)
Per previous referral notes  "08/10/2021: Call placed to patient to schedule consultation.    Patient declined appointment at this time citing that she has upcoming colonoscopy already scheduled. Patient also reports that she is not in pain at this time.    States that she will call back to schedule.  CSix, LPN/RSA"  I have sent new referral

## 2021-10-11 LAB — SURGICAL PATHOLOGY

## 2021-10-16 ENCOUNTER — Encounter (HOSPITAL_COMMUNITY): Payer: Self-pay | Admitting: Internal Medicine

## 2021-10-18 ENCOUNTER — Other Ambulatory Visit: Payer: Self-pay | Admitting: Internal Medicine

## 2021-10-18 DIAGNOSIS — K219 Gastro-esophageal reflux disease without esophagitis: Secondary | ICD-10-CM

## 2021-10-18 MED ORDER — METRONIDAZOLE 500 MG PO TABS: 500.0000 mg | ORAL_TABLET | Freq: Three times a day (TID) | ORAL | 0 refills | Status: AC

## 2021-10-18 MED ORDER — BISMUTH 262 MG PO CHEW: 2.0000 | CHEWABLE_TABLET | Freq: Four times a day (QID) | ORAL | 0 refills | Status: AC

## 2021-10-18 MED ORDER — PANTOPRAZOLE SODIUM 40 MG PO TBEC: 40.0000 mg | DELAYED_RELEASE_TABLET | Freq: Two times a day (BID) | ORAL | 1 refills | Status: AC

## 2021-10-18 MED ORDER — TETRACYCLINE HCL 500 MG PO CAPS: 500.0000 mg | ORAL_CAPSULE | Freq: Four times a day (QID) | ORAL | 0 refills | Status: AC

## 2021-11-01 ENCOUNTER — Encounter: Payer: Self-pay | Admitting: General Surgery

## 2021-11-01 ENCOUNTER — Ambulatory Visit: Payer: Medicare Other | Admitting: General Surgery

## 2021-11-01 VITALS — BP 143/101 | HR 90 | Temp 98.3°F | Resp 14 | Ht 59.0 in | Wt 164.0 lb

## 2021-11-01 DIAGNOSIS — D135 Benign neoplasm of extrahepatic bile ducts: Secondary | ICD-10-CM | POA: Diagnosis not present

## 2021-11-01 NOTE — Patient Instructions (Signed)
Minimally Invasive Cholecystectomy Minimally invasive cholecystectomy is surgery to remove the gallbladder. The gallbladder is a pear-shaped organ that lies beneath the liver on the right side of the body. The gallbladder stores bile, which is a fluid that helps the body digest fats. Cholecystectomy is often done to treat inflammation (irritation and swelling) of the gallbladder (cholecystitis). This condition is usually caused by a buildup of gallstones (cholelithiasis) in the gallbladder or when the fluid in the gall bladder becomes stagnant because gallstones get stuck in the ducts (tubes) and block the flow of bile. This can result in inflammation and pain. In severe cases, emergency surgery may be required. This procedure is done through small incisions in the abdomen, instead of one large incision. It is also called laparoscopic surgery. A thin scope with a camera (laparoscope) is inserted through one incision. Then surgical instruments are inserted through the other incisions. In some cases, a minimally invasive surgery may need to be changed to a surgery that is done through a larger incision. This is called open surgery. Tell a health care provider about: Any allergies you have. All medicines you are taking, including vitamins, herbs, eye drops, creams, and over-the-counter medicines. Any problems you or family members have had with anesthetic medicines. Any bleeding problems you have. Any surgeries you have had. Any medical conditions you have. Whether you are pregnant or may be pregnant. What are the risks? Generally, this is a safe procedure. However, problems may occur, including: Infection. Bleeding. Allergic reactions to medicines. Damage to nearby structures or organs. A gallstone remaining in the common bile duct. The common bile duct carries bile from the gallbladder to the small intestine. A bile leak from the liver or cystic duct after your gallbladder is removed. What happens  before the procedure? Medicines Ask your health care provider about: Changing or stopping your regular medicines. This is especially important if you are taking diabetes medicines or blood thinners. Taking medicines such as aspirin and ibuprofen. These medicines can thin your blood. Do not take these medicines unless your health care provider tells you to take them. Taking over-the-counter medicines, vitamins, herbs, and supplements. General instructions If you will be going home right after the procedure, plan to have a responsible adult: Take you home from the hospital or clinic. You will not be allowed to drive. Care for you for the time you are told. Do not use any products that contain nicotine or tobacco for at least 4 weeks before the procedure. These products include cigarettes, chewing tobacco, and vaping devices, such as e-cigarettes. If you need help quitting, ask your health care provider. Ask your health care provider: How your surgery site will be marked. What steps will be taken to help prevent infection. These may include: Removing hair at the surgery site. Washing skin with a germ-killing soap. Taking antibiotic medicine. What happens during the procedure?  An IV will be inserted into one of your veins. You will be given one or both of the following: A medicine to help you relax (sedative). A medicine to make you fall asleep (general anesthetic). Your surgeon will make several small incisions in your abdomen. The laparoscope will be inserted through one of the small incisions. The camera on the laparoscope will send images to a monitor in the operating room. This lets your surgeon see inside your abdomen. A gas will be pumped into your abdomen. This will expand your abdomen to give the surgeon more room to perform the surgery. Other tools that   are needed for the procedure will be inserted through the other incisions. The gallbladder will be removed through one of the  incisions. Your common bile duct may be examined. If stones are found in the common bile duct, they may be removed. After your gallbladder has been removed, the incisions will be closed with stitches (sutures), staples, or skin glue. Your incisions will be covered with a bandage (dressing). The procedure may vary among health care providers and hospitals. What happens after the procedure? Your blood pressure, heart rate, breathing rate, and blood oxygen level will be monitored until you leave the hospital or clinic. You will be given medicines as needed to control your pain. You may have a drain placed in the incision. The drain will be removed a day or two after the procedure. Summary Minimally invasive cholecystectomy, also called laparoscopic cholecystectomy, is surgery to remove the gallbladder using small incisions. Tell your health care provider about all the medical conditions you have and all the medicines you are taking for those conditions. Before the procedure, follow instructions about when to stop eating and drinking and changing or stopping medicines. Plan to have a responsible adult care for you for the time you are told after you leave the hospital or clinic. This information is not intended to replace advice given to you by your health care provider. Make sure you discuss any questions you have with your health care provider. Document Revised: 07/11/2020 Document Reviewed: 07/11/2020 Elsevier Patient Education  2023 [employer]  

## 2021-11-01 NOTE — Progress Notes (Signed)
[facility] History and Physical  Reason for Referral: Chronic nausea and vomiting; Adenomyomatosis of gallbladder  Referring Physician: Dr. Abbey Chatters   Chief Complaint   New Patient (Initial Visit)     Janice Hamilton is a 71 y.o. female.  HPI: Janice Hamilton is a 71 yo who has been having nausea and vomiting chronically. She had an EGD that demonstrated an H pylori infection and she has been treated for this at this time. She had a normal HIDA and an [place] that demonstrated possible adenomyomatosis of the gallbladder.   Past Medical History:  Diagnosis Date   Allergy    Anxiety    Blood transfusion without reported diagnosis    Depression    Diabetes mellitus without complication ([place])    Hypertension    Sleep apnea     Past Surgical History:  Procedure Laterality Date   ABDOMINAL HYSTERECTOMY     BIOPSY  10/09/2021   Procedure: BIOPSY;  Surgeon: Eloise Harman, DO;  Location: [facility];  Service: Endoscopy;;   COLONOSCOPY     COLONOSCOPY WITH PROPOFOL N/A 10/09/2021   Procedure: COLONOSCOPY WITH PROPOFOL;  Surgeon: Eloise Harman, DO;  Location: [facility];  Service: Endoscopy;  Laterality: N/A;  8:00 am  ASA 2   ESOPHAGOGASTRODUODENOSCOPY (EGD) WITH PROPOFOL N/A 10/09/2021   Procedure: ESOPHAGOGASTRODUODENOSCOPY (EGD) WITH PROPOFOL;  Surgeon: Eloise Harman, DO;  Location: [facility];  Service: Endoscopy;  Laterality: N/A;   POLYPECTOMY  10/09/2021   Procedure: POLYPECTOMY;  Surgeon: Eloise Harman, DO;  Location: [facility];  Service: Endoscopy;;    Family History  Problem Relation Age of Onset   Diabetes Mother    Stroke Father    Hypothyroidism Sister    Sleep apnea Neg Hx    Colon cancer Neg Hx     Social History   Tobacco Use   Smoking status: Every Day    Packs/day: 0.50    Types: Cigarettes   Smokeless tobacco: Never  Vaping Use   Vaping Use: Never used  Substance Use Topics   Alcohol use: No   Drug use: No     Medications: I have reviewed the patient's current medications. Allergies as of 11/01/2021   No Known Allergies      Medication List        Accurate as of November 01, 2021 10:32 AM. If you have any questions, ask your nurse or doctor.          Bismuth 262 MG Chew Chew 2 tablets by mouth in the morning, at noon, in the evening, and at bedtime for 14 days.   CALCIUM PO Take 1 tablet by mouth daily.   citalopram 10 MG tablet Commonly known as: CELEXA Take 10 mg by mouth daily. What changed: Another medication with the same name was removed. Continue taking this medication, and follow the directions you see here. Changed by: Virl Cagey, MD   cyclobenzaprine 10 MG tablet Commonly known as: FLEXERIL Take 0.5-1 tablets (5-10 mg total) by mouth 3 (three) times daily as needed for muscle spasms. Do not mix with narcotics. May cause drowsiness. What changed:  how much to take when to take this   EQ Allergy Relief (Cetirizine) 10 MG tablet Generic drug: cetirizine Take 1 tablet by mouth once daily   ibuprofen 800 MG tablet Commonly known as: ADVIL Take 800 mg by mouth every 8 (eight) hours as needed for moderate pain.   LORazepam 0.5 MG tablet  Commonly known as: ATIVAN Take 0.5-1 tablets (0.25-0.5 mg total) by mouth daily as needed for anxiety. What changed:  when to take this additional instructions   metFORMIN 500 MG 24 hr tablet Commonly known as: GLUCOPHAGE-XR Take 500 mg by mouth daily.   metroNIDAZOLE 500 MG tablet Commonly known as: FLAGYL Take 1 tablet (500 mg total) by mouth 3 (three) times daily for 14 days.   NON FORMULARY Pt uses a cpap nightly   ondansetron 4 MG disintegrating tablet Commonly known as: ZOFRAN-ODT Take 4 mg by mouth every 8 (eight) hours as needed.   pantoprazole 40 MG tablet Commonly known as: PROTONIX Take 1 tablet (40 mg total) by mouth 2 (two) times daily.   rosuvastatin 10 MG tablet Commonly known as:  CRESTOR Take 10 mg by mouth daily. What changed: Another medication with the same name was removed. Continue taking this medication, and follow the directions you see here. Changed by: Virl Cagey, MD   tetracycline 500 MG capsule Commonly known as: SUMYCIN Take 1 capsule (500 mg total) by mouth 4 (four) times daily for 14 days.   VITAMIN C PO Take 1 tablet by mouth daily.   VITAMIN D3 PO Take 1 tablet by mouth daily.         ROS:  A comprehensive review of systems was negative except for: Gastrointestinal: positive for abdominal pain, nausea, and vomiting  Blood pressure (!) 143/101, pulse 90, temperature 98.3 F (36.8 C), temperature source Oral, resp. rate 14, height '4\' 11"'$  (1.499 m), weight 164 lb (74.4 kg), SpO2 94 %. Physical Exam Vitals reviewed.  Constitutional:      Appearance: Normal appearance.  HENT:     Head: Normocephalic.     Nose: Nose normal.     Mouth/Throat:     Mouth: Mucous membranes are moist.  Eyes:     Extraocular Movements: Extraocular movements intact.  Cardiovascular:     Rate and Rhythm: Normal rate and regular rhythm.  Pulmonary:     Effort: Pulmonary effort is normal.     Breath sounds: Normal breath sounds.  Abdominal:     General: There is no distension.     Palpations: Abdomen is soft.     Tenderness: There is no abdominal tenderness.  Musculoskeletal:        General: Normal range of motion.     Cervical back: Normal range of motion.  Skin:    General: Skin is warm.  Neurological:     General: No focal deficit present.     Mental Status: He is alert and oriented to person, place, and time.  Psychiatric:        Mood and Affect: Mood normal.        Behavior: Behavior normal.        Thought Content: Thought content normal.     Results: CLINICAL DATA:  Abdominal bloating and distension.   EXAM: ABDOMEN ULTRASOUND COMPLETE   COMPARISON:  None.   FINDINGS: Gallbladder: Wall thickening in the fundal region with  echogenic foci likely related to adenomyomatosis. No gallbladder wall thickening or pericholecystic fluid. Sonographer reports no sonographic Murphy sign. Possible tiny gallstones. Soft tissue nodularity identified in the region of the gallbladder fundus, likely related to the adenoma mitosis.   Common bile duct: Diameter: 6 mm, upper normal   Liver: Increased echogenicity suggests fatty deposition. Portal vein is patent on color Doppler imaging with normal direction of blood flow towards the liver.   IVC: No abnormality visualized.  Pancreas: Obscured by bowel gas.   Spleen: Size and appearance within normal limits.   Right Kidney: Length: 10.9 cm.  3.2 cm exophytic interpolar cyst.   Left Kidney: Length: 10.8 cm. Small hypoechoic lesions are too small to characterize but likely represent tiny cyst. 5.6 cm hypoechoic exophytic cyst noted.   Abdominal aorta: No aneurysm visualized.   Other findings: None.   IMPRESSION: 1. Wall thickening of the gallbladder fundus with echogenic foci likely related to adenomyomatosis. Possible tiny gallstones. 2. Soft tissue nodularity in the region of the gallbladder fundus, likely related to the adenomyomatosis. MRI of the abdomen without and with contrast recommended to confirm. 3. Bilateral renal cysts.     Electronically Signed   By: Misty Stanley M.D.   On: 04/02/2021 13:54  CLINICAL DATA:  Epigastric abdominal pain, nausea and vomiting, triggered by food.   EXAM: NUCLEAR MEDICINE HEPATOBILIARY IMAGING WITH GALLBLADDER EF   TECHNIQUE: Sequential images of the abdomen were obtained out to 60 minutes following intravenous administration of radiopharmaceutical. After oral ingestion of Ensure, gallbladder ejection fraction was determined. At 60 min, normal ejection fraction is greater than 33%.   RADIOPHARMACEUTICALS:  5.3 mCi Tc-50m Choletec IV   COMPARISON:  04/02/2021 abdominal sonogram.   FINDINGS: Prompt uptake and  biliary excretion of activity by the liver is seen. Gallbladder activity is visualized, consistent with patency of cystic duct. Biliary activity passes into small bowel, consistent with patent common bile duct.   Calculated gallbladder ejection fraction is 45%. (Normal gallbladder ejection fraction with Ensure is greater than 33%.)   IMPRESSION: Normal hepatobiliary scintigraphy study.     Electronically Signed   By: JIlona SorrelM.D.   On: 04/13/2021 11:59    CLINICAL DATA:  Abdominal pain, abnormal CT, possible mass of the gallbladder fundus   EXAM: MRI ABDOMEN WITHOUT AND WITH CONTRAST   TECHNIQUE: Multiplanar multisequence MR imaging of the abdomen was performed both before and after the administration of intravenous contrast.   CONTRAST:  7.558mGADAVIST GADOBUTROL 1 MMOL/ML IV SOLN   COMPARISON:  CT abdomen pelvis, 04/23/2021   FINDINGS: Lower chest: No acute findings.   Hepatobiliary: Severe hepatic steatosis. No liver mass or other parenchymal abnormality identified. No gallstones. There is a nodule at the gallbladder fundus with thin internal septations and small cystic spaces, with intrinsic T1 hyperintensity and some peripheral, septal appearing enhancement (series 104, image 21, series 112, image 55). Lesion measures 2.2 x 1.8 cm. No biliary ductal dilatation.   Pancreas: No mass, inflammatory changes, or other parenchymal abnormality identified.No pancreatic ductal dilatation.   Spleen:  Within normal limits in size and appearance.   Adrenals/Urinary Tract: Normal adrenal glands. Simple, benign bilateral renal cortical cysts, for which no further follow-up or characterization is required. No renal masses or suspicious contrast enhancement identified. No evidence of hydronephrosis.   Stomach/Bowel: Visualized portions within the abdomen are unremarkable.   Vascular/Lymphatic: No pathologically enlarged lymph nodes identified. No abdominal aortic  aneurysm demonstrated. Aortic atherosclerosis.   Other:  None.   Musculoskeletal: No suspicious osseous lesions identified.   IMPRESSION: 1. Nodule at the gallbladder fundus with thin internal septations and small cystic spaces. Lesion measures 2.2 x 1.8 cm, with intrinsic T1 hyperintensity and some peripheral, septal appearing enhancement. This most likely reflects unusually prominent focal gallbladder adenomyomatosis with prominent Rokitansky-Aschoff sinuses, however neoplasm is difficult to exclude. Consider surgical consultation and follow-up MRI in 6 months to ensure stability. 2. Severe hepatic steatosis. 3. Simple, benign bilateral renal  cortical cysts, for which no further follow-up or characterization is required.   Aortic Atherosclerosis (ICD10-I70.0).     Electronically Signed   By: Delanna Ahmadi M.D.   On: 08/09/2021 10:54    Assessment & Plan:  Matelyn Antonelli is a 71 y.o. female with chronic nausea and vomiting. She has concern for adenomyomatosis of the gallbladder but they cannot exclude a neoplasm. She had a normal HIDA but discussed she could have some chronic cholecystitis or even possibly from the adenomyomatosis in a similar way a polyp causes symptoms. We discussed that her symptoms might not change at all but that with this lesion if she does not get it removed we would have to have surveillance on the lesion.    PLAN: I counseled the patient about the indication, risks and benefits of laparoscopic cholecystectomy.  He understands there is a very small chance for bleeding, infection, injury to normal structures (including common bile duct), conversion to open surgery, persistent symptoms, evolution of postcholecystectomy diarrhea, need for secondary interventions, anesthesia reaction, cardiopulmonary issues and other risks not specifically detailed here. I described the expected recovery, the plan for follow-up and the restrictions during the recovery phase.  All  questions were answered.  She wants to proceed and we discussed that in November we will have the [place] and she wants to proceed with surgery on this platform. She understands it is still minimally invasive surgery and that the same risk apply.      Virl Cagey 11/01/2021, 10:32 AM

## 2021-11-06 NOTE — H&P (Signed)
[facility] History and Physical  Reason for Referral: Chronic nausea and vomiting; Adenomyomatosis of gallbladder  Referring Physician: Dr. Abbey Chatters   Chief Complaint   New Patient (Initial Visit)     Janice Hamilton is a 71 y.o. female.  HPI: Janice Hamilton is a 71 yo who has been having nausea and vomiting chronically. She had an EGD that demonstrated an H pylori infection and she has been treated for this at this time. She had a normal HIDA and an [place] that demonstrated possible adenomyomatosis of the gallbladder.   Past Medical History:  Diagnosis Date   Allergy    Anxiety    Blood transfusion without reported diagnosis    Depression    Diabetes mellitus without complication ([place])    Hypertension    Sleep apnea     Past Surgical History:  Procedure Laterality Date   ABDOMINAL HYSTERECTOMY     BIOPSY  10/09/2021   Procedure: BIOPSY;  Surgeon: Eloise Harman, DO;  Location: [facility];  Service: Endoscopy;;   COLONOSCOPY     COLONOSCOPY WITH PROPOFOL N/A 10/09/2021   Procedure: COLONOSCOPY WITH PROPOFOL;  Surgeon: Eloise Harman, DO;  Location: [facility];  Service: Endoscopy;  Laterality: N/A;  8:00 am  ASA 2   ESOPHAGOGASTRODUODENOSCOPY (EGD) WITH PROPOFOL N/A 10/09/2021   Procedure: ESOPHAGOGASTRODUODENOSCOPY (EGD) WITH PROPOFOL;  Surgeon: Eloise Harman, DO;  Location: [facility];  Service: Endoscopy;  Laterality: N/A;   POLYPECTOMY  10/09/2021   Procedure: POLYPECTOMY;  Surgeon: Eloise Harman, DO;  Location: [facility];  Service: Endoscopy;;    Family History  Problem Relation Age of Onset   Diabetes Mother    Stroke Father    Hypothyroidism Sister    Sleep apnea Neg Hx    Colon cancer Neg Hx     Social History   Tobacco Use   Smoking status: Every Day    Packs/day: 0.50    Types: Cigarettes   Smokeless tobacco: Never  Vaping Use   Vaping Use: Never used  Substance Use Topics   Alcohol use: No   Drug use: No     Medications: I have reviewed the patient's current medications. Allergies as of 11/01/2021   No Known Allergies      Medication List        Accurate as of November 01, 2021 10:32 AM. If you have any questions, ask your nurse or doctor.          Bismuth 262 MG Chew Chew 2 tablets by mouth in the morning, at noon, in the evening, and at bedtime for 14 days.   CALCIUM PO Take 1 tablet by mouth daily.   citalopram 10 MG tablet Commonly known as: CELEXA Take 10 mg by mouth daily. What changed: Another medication with the same name was removed. Continue taking this medication, and follow the directions you see here. Changed by: Virl Cagey, MD   cyclobenzaprine 10 MG tablet Commonly known as: FLEXERIL Take 0.5-1 tablets (5-10 mg total) by mouth 3 (three) times daily as needed for muscle spasms. Do not mix with narcotics. May cause drowsiness. What changed:  how much to take when to take this   EQ Allergy Relief (Cetirizine) 10 MG tablet Generic drug: cetirizine Take 1 tablet by mouth once daily   ibuprofen 800 MG tablet Commonly known as: ADVIL Take 800 mg by mouth every 8 (eight) hours as needed for moderate pain.   LORazepam 0.5 MG tablet  Commonly known as: ATIVAN Take 0.5-1 tablets (0.25-0.5 mg total) by mouth daily as needed for anxiety. What changed:  when to take this additional instructions   metFORMIN 500 MG 24 hr tablet Commonly known as: GLUCOPHAGE-XR Take 500 mg by mouth daily.   metroNIDAZOLE 500 MG tablet Commonly known as: FLAGYL Take 1 tablet (500 mg total) by mouth 3 (three) times daily for 14 days.   NON FORMULARY Pt uses a cpap nightly   ondansetron 4 MG disintegrating tablet Commonly known as: ZOFRAN-ODT Take 4 mg by mouth every 8 (eight) hours as needed.   pantoprazole 40 MG tablet Commonly known as: PROTONIX Take 1 tablet (40 mg total) by mouth 2 (two) times daily.   rosuvastatin 10 MG tablet Commonly known as:  CRESTOR Take 10 mg by mouth daily. What changed: Another medication with the same name was removed. Continue taking this medication, and follow the directions you see here. Changed by: Virl Cagey, MD   tetracycline 500 MG capsule Commonly known as: SUMYCIN Take 1 capsule (500 mg total) by mouth 4 (four) times daily for 14 days.   VITAMIN C PO Take 1 tablet by mouth daily.   VITAMIN D3 PO Take 1 tablet by mouth daily.         ROS:  A comprehensive review of systems was negative except for: Gastrointestinal: positive for abdominal pain, nausea, and vomiting  Blood pressure (!) 143/101, pulse 90, temperature 98.3 F (36.8 C), temperature source Oral, resp. rate 14, height '4\' 11"'$  (1.499 m), weight 164 lb (74.4 kg), SpO2 94 %. Physical Exam Vitals reviewed.  Constitutional:      Appearance: Normal appearance.  HENT:     Head: Normocephalic.     Nose: Nose normal.     Mouth/Throat:     Mouth: Mucous membranes are moist.  Eyes:     Extraocular Movements: Extraocular movements intact.  Cardiovascular:     Rate and Rhythm: Normal rate and regular rhythm.  Pulmonary:     Effort: Pulmonary effort is normal.     Breath sounds: Normal breath sounds.  Abdominal:     General: There is no distension.     Palpations: Abdomen is soft.     Tenderness: There is no abdominal tenderness.  Musculoskeletal:        General: Normal range of motion.     Cervical back: Normal range of motion.  Skin:    General: Skin is warm.  Neurological:     General: No focal deficit present.     Mental Status: He is alert and oriented to person, place, and time.  Psychiatric:        Mood and Affect: Mood normal.        Behavior: Behavior normal.        Thought Content: Thought content normal.     Results: CLINICAL DATA:  Abdominal bloating and distension.   EXAM: ABDOMEN ULTRASOUND COMPLETE   COMPARISON:  None.   FINDINGS: Gallbladder: Wall thickening in the fundal region with  echogenic foci likely related to adenomyomatosis. No gallbladder wall thickening or pericholecystic fluid. Sonographer reports no sonographic Murphy sign. Possible tiny gallstones. Soft tissue nodularity identified in the region of the gallbladder fundus, likely related to the adenoma mitosis.   Common bile duct: Diameter: 6 mm, upper normal   Liver: Increased echogenicity suggests fatty deposition. Portal vein is patent on color Doppler imaging with normal direction of blood flow towards the liver.   IVC: No abnormality visualized.  Pancreas: Obscured by bowel gas.   Spleen: Size and appearance within normal limits.   Right Kidney: Length: 10.9 cm.  3.2 cm exophytic interpolar cyst.   Left Kidney: Length: 10.8 cm. Small hypoechoic lesions are too small to characterize but likely represent tiny cyst. 5.6 cm hypoechoic exophytic cyst noted.   Abdominal aorta: No aneurysm visualized.   Other findings: None.   IMPRESSION: 1. Wall thickening of the gallbladder fundus with echogenic foci likely related to adenomyomatosis. Possible tiny gallstones. 2. Soft tissue nodularity in the region of the gallbladder fundus, likely related to the adenomyomatosis. MRI of the abdomen without and with contrast recommended to confirm. 3. Bilateral renal cysts.     Electronically Signed   By: Misty Stanley M.D.   On: 04/02/2021 13:54  CLINICAL DATA:  Epigastric abdominal pain, nausea and vomiting, triggered by food.   EXAM: NUCLEAR MEDICINE HEPATOBILIARY IMAGING WITH GALLBLADDER EF   TECHNIQUE: Sequential images of the abdomen were obtained out to 60 minutes following intravenous administration of radiopharmaceutical. After oral ingestion of Ensure, gallbladder ejection fraction was determined. At 60 min, normal ejection fraction is greater than 33%.   RADIOPHARMACEUTICALS:  5.3 mCi Tc-41m Choletec IV   COMPARISON:  04/02/2021 abdominal sonogram.   FINDINGS: Prompt uptake and  biliary excretion of activity by the liver is seen. Gallbladder activity is visualized, consistent with patency of cystic duct. Biliary activity passes into small bowel, consistent with patent common bile duct.   Calculated gallbladder ejection fraction is 45%. (Normal gallbladder ejection fraction with Ensure is greater than 33%.)   IMPRESSION: Normal hepatobiliary scintigraphy study.     Electronically Signed   By: JIlona SorrelM.D.   On: 04/13/2021 11:59    CLINICAL DATA:  Abdominal pain, abnormal CT, possible mass of the gallbladder fundus   EXAM: MRI ABDOMEN WITHOUT AND WITH CONTRAST   TECHNIQUE: Multiplanar multisequence MR imaging of the abdomen was performed both before and after the administration of intravenous contrast.   CONTRAST:  7.536mGADAVIST GADOBUTROL 1 MMOL/ML IV SOLN   COMPARISON:  CT abdomen pelvis, 04/23/2021   FINDINGS: Lower chest: No acute findings.   Hepatobiliary: Severe hepatic steatosis. No liver mass or other parenchymal abnormality identified. No gallstones. There is a nodule at the gallbladder fundus with thin internal septations and small cystic spaces, with intrinsic T1 hyperintensity and some peripheral, septal appearing enhancement (series 104, image 21, series 112, image 55). Lesion measures 2.2 x 1.8 cm. No biliary ductal dilatation.   Pancreas: No mass, inflammatory changes, or other parenchymal abnormality identified.No pancreatic ductal dilatation.   Spleen:  Within normal limits in size and appearance.   Adrenals/Urinary Tract: Normal adrenal glands. Simple, benign bilateral renal cortical cysts, for which no further follow-up or characterization is required. No renal masses or suspicious contrast enhancement identified. No evidence of hydronephrosis.   Stomach/Bowel: Visualized portions within the abdomen are unremarkable.   Vascular/Lymphatic: No pathologically enlarged lymph nodes identified. No abdominal aortic  aneurysm demonstrated. Aortic atherosclerosis.   Other:  None.   Musculoskeletal: No suspicious osseous lesions identified.   IMPRESSION: 1. Nodule at the gallbladder fundus with thin internal septations and small cystic spaces. Lesion measures 2.2 x 1.8 cm, with intrinsic T1 hyperintensity and some peripheral, septal appearing enhancement. This most likely reflects unusually prominent focal gallbladder adenomyomatosis with prominent Rokitansky-Aschoff sinuses, however neoplasm is difficult to exclude. Consider surgical consultation and follow-up MRI in 6 months to ensure stability. 2. Severe hepatic steatosis. 3. Simple, benign bilateral renal  cortical cysts, for which no further follow-up or characterization is required.   Aortic Atherosclerosis (ICD10-I70.0).     Electronically Signed   By: Delanna Ahmadi M.D.   On: 08/09/2021 10:54    Assessment & Plan:  Jewelle Whitner is a 71 y.o. female with chronic nausea and vomiting. She has concern for adenomyomatosis of the gallbladder but they cannot exclude a neoplasm. She had a normal HIDA but discussed she could have some chronic cholecystitis or even possibly from the adenomyomatosis in a similar way a polyp causes symptoms. We discussed that her symptoms might not change at all but that with this lesion if she does not get it removed we would have to have surveillance on the lesion.    PLAN: I counseled the patient about the indication, risks and benefits of laparoscopic cholecystectomy.  He understands there is a very small chance for bleeding, infection, injury to normal structures (including common bile duct), conversion to open surgery, persistent symptoms, evolution of postcholecystectomy diarrhea, need for secondary interventions, anesthesia reaction, cardiopulmonary issues and other risks not specifically detailed here. I described the expected recovery, the plan for follow-up and the restrictions during the recovery phase.  All  questions were answered.  She wants to proceed and we discussed that in November we will have the [place] and she wants to proceed with surgery on this platform. She understands it is still minimally invasive surgery and that the same risk apply.      Virl Cagey 11/01/2021, 10:32 AM

## 2021-11-27 ENCOUNTER — Other Ambulatory Visit (HOSPITAL_COMMUNITY): Payer: Medicare Other

## 2021-11-29 ENCOUNTER — Encounter: Payer: Self-pay | Admitting: Internal Medicine

## 2021-11-29 NOTE — Patient Instructions (Signed)
Janice Hamilton  11/29/2021     '@[email or web address]'$ @   Your procedure is scheduled on  12/05/2021.   Report to [facility] at  0600  A.M.   Call this number if you have problems the morning of surgery:  [phone]  If you experience any cold or flu symptoms such as cough, fever, chills, shortness of breath, etc. between now and your scheduled surgery, please notify us at the above number.   Remember:  Do not eat or drink after midnight.      Take these medicines the morning of surgery with A SIP OF WATER             celexa, flexril, zyrtec, ativan (if needed), zofran (if needed), protonix.     Do not wear jewelry, make-up or nail polish.  Do not wear lotions, powders, or perfumes, or deodorant.  Do not shave 48 hours prior to surgery.  Men may shave face and neck.  Do not bring valuables to the hospital.  [facility] is not responsible for any belongings or valuables.  Contacts, dentures or bridgework may not be worn into surgery.  Leave your suitcase in the car.  After surgery it may be brought to your room.  For patients admitted to the hospital, discharge time will be determined by your treatment team.  Patients discharged the day of surgery will not be allowed to drive home and must have someone with them for 24 hours.    Special instructions:   DO NOT smoke tobacco or vape for 24 hours before your procedure.  Please read over the following fact sheets that you were given. Coughing and Deep Breathing, Surgical Site Infection Prevention, Anesthesia Post-op Instructions, and Care and Recovery After Surgery      Minimally Invasive Cholecystectomy, Care After The following information offers guidance on how to care for yourself after your procedure. Your health care provider may also give you more specific instructions. If you have problems or questions, contact your health care provider. What can I expect after the procedure? After the procedure, it is  common to have: Pain at your incision sites. You will be given medicines to control this pain. Mild nausea or vomiting. Bloating and possible shoulder pain from the gas that was used during the procedure. Follow these instructions at home: Medicines Take over-the-counter and prescription medicines only as told by your health care provider. If you were prescribed an antibiotic medicine, take it as told by your health care provider. Do not stop using the antibiotic even if you start to feel better. Ask your health care provider if the medicine prescribed to you: Requires you to avoid driving or using machinery. Can cause constipation. You may need to take these actions to prevent or treat constipation: Drink enough fluid to keep your urine pale yellow. Take over-the-counter or prescription medicines. Eat foods that are high in fiber, such as beans, whole grains, and fresh fruits and vegetables. Limit foods that are high in fat and processed sugars, such as fried or sweet foods. Incision care  Follow instructions from your health care provider about how to take care of your incisions. Make sure you: Wash your hands with soap and water for at least 20 seconds before and after you change your bandage (dressing). If soap and water are not available, use hand sanitizer. Change your dressing as told by your health care provider. Leave stitches (sutures), skin glue, or adhesive strips in place. These  skin closures may need to be in place for 2 weeks or longer. If adhesive strip edges start to loosen and curl up, you may trim the loose edges. Do not remove adhesive strips completely unless your health care provider tells you to do that. Do not take baths, swim, or use a hot tub until your health care provider approves. Ask your health care provider if you may take showers. You may only be allowed to take sponge baths. Check your incision area every day for signs of infection. Check for: More redness,  swelling, or pain. Fluid or blood. Warmth. Pus or a bad smell. Activity Rest as told by your health care provider. Do not do activities that require a lot of effort. Avoid sitting for a long time without moving. Get up to take short walks every 1-2 hours. This is important to improve blood flow and breathing. Ask for help if you feel weak or unsteady. Do not lift anything that is heavier than 10 lb (4.5 kg), or the limit that you are told, until your health care provider says that it is safe. Do not play contact sports until your health care provider approves. Do not return to work or school until your health care provider approves. Return to your normal activities as told by your health care provider. Ask your health care provider what activities are safe for you. General instructions If you were given a sedative during the procedure, it can affect you for several hours. Do not drive or operate machinery until your health care provider says that it is safe. Keep all follow-up visits. This is important. Contact a health care provider if: You develop a rash. You have more redness, swelling, or pain around your incisions. You have fluid or blood coming from your incisions. Your incisions feel warm to the touch. You have pus or a bad smell coming from your incisions. You have a fever. One or more of your incisions breaks open. Get help right away if: You have trouble breathing. You have chest pain. You have more pain in your shoulders. You faint or feel dizzy when you stand. You have severe pain in your abdomen. You have nausea or vomiting that lasts for more than one day. You have leg pain that is new or unusual, or if it is localized to one specific spot. These symptoms may represent a serious problem that is an emergency. Do not wait to see if the symptoms will go away. Get medical help right away. Call your local emergency services (911 in the [place] Do not drive yourself to the  hospital. Summary After your procedure, it is common to have pain at the incision sites. You may also have nausea or bloating. Follow your health care provider's instructions about medicine, activity restrictions, and caring for your incision areas. Do not do activities that require a lot of effort. Contact a health care provider if you have a fever or other signs of infection, such as more redness, swelling, or pain around the incisions. Get help right away if you have chest pain, increasing pain in the shoulders, or trouble breathing. This information is not intended to replace advice given to you by your health care provider. Make sure you discuss any questions you have with your health care provider. Document Revised: 07/11/2020 Document Reviewed: 07/11/2020 Elsevier Patient Education  [place] Anesthesia, Adult, Care After The following information offers guidance on how to care for yourself after your procedure. Your health care provider  may also give you more specific instructions. If you have problems or questions, contact your health care provider. What can I expect after the procedure? After the procedure, it is common for people to: Have pain or discomfort at the IV site. Have nausea or vomiting. Have a sore throat or hoarseness. Have trouble concentrating. Feel cold or chills. Feel weak, sleepy, or tired (fatigue). Have soreness and body aches. These can affect parts of the body that were not involved in surgery. Follow these instructions at home: For the time period you were told by your health care provider:  Rest. Do not participate in activities where you could fall or become injured. Do not drive or use machinery. Do not drink alcohol. Do not take sleeping pills or medicines that cause drowsiness. Do not make important decisions or sign legal documents. Do not take care of children on your own. General instructions Drink enough fluid to keep your  urine pale yellow. If you have sleep apnea, surgery and certain medicines can increase your risk for breathing problems. Follow instructions from your health care provider about wearing your sleep device: Anytime you are sleeping, including during daytime naps. While taking prescription pain medicines, sleeping medicines, or medicines that make you drowsy. Return to your normal activities as told by your health care provider. Ask your health care provider what activities are safe for you. Take over-the-counter and prescription medicines only as told by your health care provider. Do not use any products that contain nicotine or tobacco. These products include cigarettes, chewing tobacco, and vaping devices, such as e-cigarettes. These can delay incision healing after surgery. If you need help quitting, ask your health care provider. Contact a health care provider if: You have nausea or vomiting that does not get better with medicine. You vomit every time you eat or drink. You have pain that does not get better with medicine. You cannot urinate or have bloody urine. You develop a skin rash. You have a fever. Get help right away if: You have trouble breathing. You have chest pain. You vomit blood. These symptoms may be an emergency. Get help right away. Call 911. Do not wait to see if the symptoms will go away. Do not drive yourself to the hospital. Summary After the procedure, it is common to have a sore throat, hoarseness, nausea, vomiting, or to feel weak, sleepy, or fatigue. For the time period you were told by your health care provider, do not drive or use machinery. Get help right away if you have difficulty breathing, have chest pain, or vomit blood. These symptoms may be an emergency. This information is not intended to replace advice given to you by your health care provider. Make sure you discuss any questions you have with your health care provider. Document Revised: 04/06/2021  Document Reviewed: 04/06/2021 Elsevier Patient Education  [place]. How to Use Chlorhexidine Before Surgery Chlorhexidine gluconate (CHG) is a germ-killing (antiseptic) solution that is used to clean the skin. It can get rid of the bacteria that normally live on the skin and can keep them away for about 24 hours. To clean your skin with CHG, you may be given: A CHG solution to use in the shower or as part of a sponge bath. A prepackaged cloth that contains CHG. Cleaning your skin with CHG may help lower the risk for infection: While you are staying in the intensive care unit of the hospital. If you have a vascular access, such as a central line, to  provide short-term or long-term access to your veins. If you have a catheter to drain urine from your bladder. If you are on a ventilator. A ventilator is a machine that helps you breathe by moving air in and out of your lungs. After surgery. What are the risks? Risks of using CHG include: A skin reaction. Hearing loss, if CHG gets in your ears and you have a perforated eardrum. Eye injury, if CHG gets in your eyes and is not rinsed out. The CHG product catching fire. Make sure that you avoid smoking and flames after applying CHG to your skin. Do not use CHG: If you have a chlorhexidine allergy or have previously reacted to chlorhexidine. On babies younger than 18 months of age. How to use CHG solution Use CHG only as told by your health care provider, and follow the instructions on the label. Use the full amount of CHG as directed. Usually, this is one bottle. During a shower Follow these steps when using CHG solution during a shower (unless your health care provider gives you different instructions): Start the shower. Use your normal soap and shampoo to wash your face and hair. Turn off the shower or move out of the shower stream. Pour the CHG onto a clean washcloth. Do not use any type of brush or rough-edged sponge. Starting at  your neck, lather your body down to your toes. Make sure you follow these instructions: If you will be having surgery, pay special attention to the part of your body where you will be having surgery. Scrub this area for at least 1 minute. Do not use CHG on your head or face. If the solution gets into your ears or eyes, rinse them well with water. Avoid your genital area. Avoid any areas of skin that have broken skin, cuts, or scrapes. Scrub your back and under your arms. Make sure to wash skin folds. Let the lather sit on your skin for 1-2 minutes or as long as told by your health care provider. Thoroughly rinse your entire body in the shower. Make sure that all body creases and crevices are rinsed well. Dry off with a clean towel. Do not put any substances on your body afterward--such as powder, lotion, or perfume--unless you are told to do so by your health care provider. Only use lotions that are recommended by the manufacturer. Put on clean clothes or pajamas. If it is the night before your surgery, sleep in clean sheets.  During a sponge bath Follow these steps when using CHG solution during a sponge bath (unless your health care provider gives you different instructions): Use your normal soap and shampoo to wash your face and hair. Pour the CHG onto a clean washcloth. Starting at your neck, lather your body down to your toes. Make sure you follow these instructions: If you will be having surgery, pay special attention to the part of your body where you will be having surgery. Scrub this area for at least 1 minute. Do not use CHG on your head or face. If the solution gets into your ears or eyes, rinse them well with water. Avoid your genital area. Avoid any areas of skin that have broken skin, cuts, or scrapes. Scrub your back and under your arms. Make sure to wash skin folds. Let the lather sit on your skin for 1-2 minutes or as long as told by your health care provider. Using a  different clean, wet washcloth, thoroughly rinse your entire body. Make sure that  all body creases and crevices are rinsed well. Dry off with a clean towel. Do not put any substances on your body afterward--such as powder, lotion, or perfume--unless you are told to do so by your health care provider. Only use lotions that are recommended by the manufacturer. Put on clean clothes or pajamas. If it is the night before your surgery, sleep in clean sheets. How to use CHG prepackaged cloths Only use CHG cloths as told by your health care provider, and follow the instructions on the label. Use the CHG cloth on clean, dry skin. Do not use the CHG cloth on your head or face unless your health care provider tells you to. When washing with the CHG cloth: Avoid your genital area. Avoid any areas of skin that have broken skin, cuts, or scrapes. Before surgery Follow these steps when using a CHG cloth to clean before surgery (unless your health care provider gives you different instructions): Using the CHG cloth, vigorously scrub the part of your body where you will be having surgery. Scrub using a back-and-forth motion for 3 minutes. The area on your body should be completely wet with CHG when you are done scrubbing. Do not rinse. Discard the cloth and let the area air-dry. Do not put any substances on the area afterward, such as powder, lotion, or perfume. Put on clean clothes or pajamas. If it is the night before your surgery, sleep in clean sheets.  For general bathing Follow these steps when using CHG cloths for general bathing (unless your health care provider gives you different instructions). Use a separate CHG cloth for each area of your body. Make sure you wash between any folds of skin and between your fingers and toes. Wash your body in the following order, switching to a new cloth after each step: The front of your neck, shoulders, and chest. Both of your arms, under your arms, and your  hands. Your stomach and groin area, avoiding the genitals. Your right leg and foot. Your left leg and foot. The back of your neck, your back, and your buttocks. Do not rinse. Discard the cloth and let the area air-dry. Do not put any substances on your body afterward--such as powder, lotion, or perfume--unless you are told to do so by your health care provider. Only use lotions that are recommended by the manufacturer. Put on clean clothes or pajamas. Contact a health care provider if: Your skin gets irritated after scrubbing. You have questions about using your solution or cloth. You swallow any chlorhexidine. Call your local poison control center (1-701-365-7410 in the [place] Get help right away if: Your eyes itch badly, or they become very red or swollen. Your skin itches badly and is red or swollen. Your hearing changes. You have trouble seeing. You have swelling or tingling in your mouth or throat. You have trouble breathing. These symptoms may represent a serious problem that is an emergency. Do not wait to see if the symptoms will go away. Get medical help right away. Call your local emergency services (911 in the [place] Do not drive yourself to the hospital. Summary Chlorhexidine gluconate (CHG) is a germ-killing (antiseptic) solution that is used to clean the skin. Cleaning your skin with CHG may help to lower your risk for infection. You may be given CHG to use for bathing. It may be in a bottle or in a prepackaged cloth to use on your skin. Carefully follow your health care provider's instructions and the instructions on the product  label. Do not use CHG if you have a chlorhexidine allergy. Contact your health care provider if your skin gets irritated after scrubbing. This information is not intended to replace advice given to you by your health care provider. Make sure you discuss any questions you have with your health care provider. Document Revised: 05/07/2021 Document  Reviewed: 03/20/2020 Elsevier Patient Education  [place].

## 2021-11-30 ENCOUNTER — Encounter (HOSPITAL_COMMUNITY): Payer: Self-pay

## 2021-11-30 ENCOUNTER — Other Ambulatory Visit: Payer: Self-pay

## 2021-11-30 ENCOUNTER — Encounter (HOSPITAL_COMMUNITY)
Admission: RE | Admit: 2021-11-30 | Discharge: 2021-11-30 | Disposition: A | Discharge status: Home / Self Care | Payer: Medicare Other | Source: Ambulatory Visit | Attending: General Surgery | Admitting: General Surgery

## 2021-11-30 VITALS — BP 169/88 | HR 91 | Temp 97.6°F | Resp 18 | Ht 59.0 in | Wt 160.0 lb

## 2021-11-30 DIAGNOSIS — I10 Essential (primary) hypertension: Secondary | ICD-10-CM | POA: Insufficient documentation

## 2021-11-30 DIAGNOSIS — F17219 Nicotine dependence, cigarettes, with unspecified nicotine-induced disorders: Secondary | ICD-10-CM | POA: Diagnosis not present

## 2021-11-30 DIAGNOSIS — Z01818 Encounter for other preprocedural examination: Secondary | ICD-10-CM | POA: Diagnosis not present

## 2021-11-30 DIAGNOSIS — E119 Type 2 diabetes mellitus without complications: Secondary | ICD-10-CM | POA: Diagnosis not present

## 2021-11-30 HISTORY — DX: Gastro-esophageal reflux disease without esophagitis: K21.9

## 2021-11-30 LAB — CBC WITH DIFFERENTIAL/PLATELET
Abs Immature Granulocytes: 0.03 10*3/uL (ref 0.00–0.07)
Basophils Absolute: 0.1 10*3/uL (ref 0.0–0.1)
Basophils Relative: 1 %
Eosinophils Absolute: 0.2 10*3/uL (ref 0.0–0.5)
Eosinophils Relative: 2 %
HCT: 40.1 % (ref 36.0–46.0)
Hemoglobin: 13.3 g/dL (ref 12.0–15.0)
Immature Granulocytes: 0 %
Lymphocytes Relative: 29 %
Lymphs Abs: 3 10*3/uL (ref 0.7–4.0)
MCH: 29.8 pg (ref 26.0–34.0)
MCHC: 33.2 g/dL (ref 30.0–36.0)
MCV: 89.7 fL (ref 80.0–100.0)
Monocytes Absolute: 0.6 10*3/uL (ref 0.1–1.0)
Monocytes Relative: 6 %
Neutro Abs: 6.4 10*3/uL (ref 1.7–7.7)
Neutrophils Relative %: 62 %
Platelets: 338 10*3/uL (ref 150–400)
RBC: 4.47 MIL/uL (ref 3.87–5.11)
RDW: 13.2 % (ref 11.5–15.5)
WBC: 10.2 10*3/uL (ref 4.0–10.5)
nRBC: 0 % (ref 0.0–0.2)

## 2021-11-30 LAB — HEMOGLOBIN A1C
Hgb A1c MFr Bld: 6.3 % — ABNORMAL HIGH (ref 4.8–5.6)
Mean Plasma Glucose: 134.11 mg/dL

## 2021-11-30 LAB — BASIC METABOLIC PANEL
Anion gap: 9 (ref 5–15)
BUN: 17 mg/dL (ref 8–23)
CO2: 25 mmol/L (ref 22–32)
Calcium: 9.2 mg/dL (ref 8.9–10.3)
Chloride: 102 mmol/L (ref 98–111)
Creatinine, Ser: 1.03 mg/dL — ABNORMAL HIGH (ref 0.44–1.00)
GFR, Estimated: 58 mL/min — ABNORMAL LOW (ref >60)
Glucose, Bld: 124 mg/dL — ABNORMAL HIGH (ref 70–99)
Potassium: 4.3 mmol/L (ref 3.5–5.1)
Sodium: 136 mmol/L (ref 135–145)

## 2021-11-30 LAB — TYPE AND SCREEN
ABO/RH(D): O POS
Antibody Screen: NEGATIVE

## 2021-12-05 ENCOUNTER — Ambulatory Visit (HOSPITAL_COMMUNITY)
Admission: RE | Admit: 2021-12-05 | Discharge: 2021-12-05 | Disposition: A | Discharge status: Home / Self Care | Payer: Medicare Other | Attending: General Surgery | Admitting: General Surgery

## 2021-12-05 ENCOUNTER — Ambulatory Visit (HOSPITAL_BASED_OUTPATIENT_CLINIC_OR_DEPARTMENT_OTHER): Payer: Medicare Other | Admitting: Anesthesiology

## 2021-12-05 ENCOUNTER — Encounter (HOSPITAL_COMMUNITY): Payer: Self-pay | Admitting: General Surgery

## 2021-12-05 ENCOUNTER — Encounter (HOSPITAL_COMMUNITY)
Admission: RE | Disposition: A | Discharge status: Home / Self Care | Payer: Self-pay | Source: Home / Self Care | Attending: General Surgery

## 2021-12-05 ENCOUNTER — Other Ambulatory Visit: Payer: Self-pay

## 2021-12-05 ENCOUNTER — Ambulatory Visit (HOSPITAL_COMMUNITY): Payer: Medicare Other | Admitting: Anesthesiology

## 2021-12-05 DIAGNOSIS — K828 Other specified diseases of gallbladder: Secondary | ICD-10-CM

## 2021-12-05 DIAGNOSIS — F17210 Nicotine dependence, cigarettes, uncomplicated: Secondary | ICD-10-CM | POA: Diagnosis not present

## 2021-12-05 DIAGNOSIS — F419 Anxiety disorder, unspecified: Secondary | ICD-10-CM | POA: Diagnosis not present

## 2021-12-05 DIAGNOSIS — D135 Benign neoplasm of extrahepatic bile ducts: Secondary | ICD-10-CM | POA: Diagnosis not present

## 2021-12-05 DIAGNOSIS — I10 Essential (primary) hypertension: Secondary | ICD-10-CM

## 2021-12-05 DIAGNOSIS — Z7984 Long term (current) use of oral hypoglycemic drugs: Secondary | ICD-10-CM | POA: Diagnosis not present

## 2021-12-05 DIAGNOSIS — K8010 Calculus of gallbladder with chronic cholecystitis without obstruction: Secondary | ICD-10-CM | POA: Insufficient documentation

## 2021-12-05 DIAGNOSIS — G4730 Sleep apnea, unspecified: Secondary | ICD-10-CM | POA: Diagnosis not present

## 2021-12-05 DIAGNOSIS — F418 Other specified anxiety disorders: Secondary | ICD-10-CM | POA: Diagnosis not present

## 2021-12-05 DIAGNOSIS — Z8619 Personal history of other infectious and parasitic diseases: Secondary | ICD-10-CM | POA: Insufficient documentation

## 2021-12-05 DIAGNOSIS — E119 Type 2 diabetes mellitus without complications: Secondary | ICD-10-CM | POA: Diagnosis not present

## 2021-12-05 DIAGNOSIS — R112 Nausea with vomiting, unspecified: Secondary | ICD-10-CM | POA: Insufficient documentation

## 2021-12-05 DIAGNOSIS — K219 Gastro-esophageal reflux disease without esophagitis: Secondary | ICD-10-CM | POA: Diagnosis not present

## 2021-12-05 DIAGNOSIS — F32A Depression, unspecified: Secondary | ICD-10-CM | POA: Diagnosis not present

## 2021-12-05 LAB — ABO/RH: ABO/RH(D): O POS

## 2021-12-05 LAB — GLUCOSE, CAPILLARY: Glucose-Capillary: 122 mg/dL — ABNORMAL HIGH (ref 70–99)

## 2021-12-05 SURGERY — CHOLECYSTECTOMY, ROBOT-ASSISTED, LAPAROSCOPIC
Anesthesia: General | Site: Abdomen

## 2021-12-05 MED ORDER — ONDANSETRON HCL 4 MG/2ML IJ SOLN: 4.0000 mg | Freq: Once | INTRAMUSCULAR | Status: DC | PRN

## 2021-12-05 MED ORDER — FENTANYL CITRATE (PF) 250 MCG/5ML IJ SOLN
INTRAMUSCULAR | Status: DC | PRN
  Administered 2021-12-05: 50 ug via INTRAVENOUS
  Administered 2021-12-05: 25 ug via INTRAVENOUS
  Administered 2021-12-05: 50 ug via INTRAVENOUS
  Administered 2021-12-05: 25 ug via INTRAVENOUS

## 2021-12-05 MED ORDER — FENTANYL CITRATE PF 50 MCG/ML IJ SOSY: 25.0000 ug | PREFILLED_SYRINGE | INTRAMUSCULAR | Status: DC | PRN

## 2021-12-05 MED ORDER — OXYCODONE HCL 5 MG PO TABS: 5.0000 mg | ORAL_TABLET | ORAL | 0 refills | Status: AC | PRN

## 2021-12-05 MED ORDER — LIDOCAINE 20MG/ML (2%) 15 ML SYRINGE OPTIME
INTRAMUSCULAR | Status: DC | PRN
  Administered 2021-12-05: 1.5 mg/kg/h via INTRAVENOUS

## 2021-12-05 MED ORDER — CHLORHEXIDINE GLUCONATE CLOTH 2 % EX PADS: 6.0000 | MEDICATED_PAD | Freq: Once | CUTANEOUS | Status: DC

## 2021-12-05 MED ORDER — BUPIVACAINE LIPOSOME 1.3 % IJ SUSP
INTRAMUSCULAR | Status: DC | PRN
  Administered 2021-12-05: 20 mL

## 2021-12-05 MED ORDER — DEXAMETHASONE SODIUM PHOSPHATE 10 MG/ML IJ SOLN
INTRAMUSCULAR | Status: AC
  Filled 2021-12-05: qty 1

## 2021-12-05 MED ORDER — DEXAMETHASONE SODIUM PHOSPHATE 10 MG/ML IJ SOLN
INTRAMUSCULAR | Status: DC | PRN
  Administered 2021-12-05: 10 mg via INTRAVENOUS

## 2021-12-05 MED ORDER — CHLORHEXIDINE GLUCONATE 0.12 % MT SOLN
15.0000 mL | Freq: Once | OROMUCOSAL | Status: AC
  Administered 2021-12-05: 15 mL via OROMUCOSAL

## 2021-12-05 MED ORDER — ORAL CARE MOUTH RINSE: 15.0000 mL | Freq: Once | OROMUCOSAL | Status: AC

## 2021-12-05 MED ORDER — SODIUM CHLORIDE 0.9 % IV SOLN
2.0000 g | INTRAVENOUS | Status: AC
  Administered 2021-12-05: 2 g via INTRAVENOUS

## 2021-12-05 MED ORDER — BUPIVACAINE LIPOSOME 1.3 % IJ SUSP
INTRAMUSCULAR | Status: AC
  Filled 2021-12-05: qty 20

## 2021-12-05 MED ORDER — ROCURONIUM BROMIDE 10 MG/ML (PF) SYRINGE
PREFILLED_SYRINGE | INTRAVENOUS | Status: AC
  Filled 2021-12-05: qty 10

## 2021-12-05 MED ORDER — PHENYLEPHRINE HCL-NACL 20-0.9 MG/250ML-% IV SOLN
INTRAVENOUS | Status: AC
  Filled 2021-12-05: qty 250

## 2021-12-05 MED ORDER — OXYCODONE HCL 5 MG/5ML PO SOLN: 5.0000 mg | Freq: Once | ORAL | Status: DC | PRN

## 2021-12-05 MED ORDER — PROPOFOL 10 MG/ML IV BOLUS
INTRAVENOUS | Status: DC | PRN
  Administered 2021-12-05: 140 mg via INTRAVENOUS

## 2021-12-05 MED ORDER — ONDANSETRON HCL 4 MG/2ML IJ SOLN
INTRAMUSCULAR | Status: AC
  Filled 2021-12-05: qty 2

## 2021-12-05 MED ORDER — ACETAMINOPHEN 10 MG/ML IV SOLN
INTRAVENOUS | Status: AC
  Filled 2021-12-05: qty 100

## 2021-12-05 MED ORDER — OXYCODONE HCL 5 MG PO TABS: 5.0000 mg | ORAL_TABLET | Freq: Once | ORAL | Status: DC | PRN

## 2021-12-05 MED ORDER — LACTATED RINGERS IV SOLN
INTRAVENOUS | Status: DC
  Administered 2021-12-05: 1000 mL via INTRAVENOUS

## 2021-12-05 MED ORDER — SODIUM CHLORIDE 0.9 % IV SOLN
INTRAVENOUS | Status: AC
  Filled 2021-12-05: qty 2

## 2021-12-05 MED ORDER — LIDOCAINE HCL (PF) 2 % IJ SOLN
INTRAMUSCULAR | Status: AC
  Filled 2021-12-05: qty 5

## 2021-12-05 MED ORDER — DEXMEDETOMIDINE HCL IN NACL 80 MCG/20ML IV SOLN
INTRAVENOUS | Status: DC | PRN
  Administered 2021-12-05 (×2): 4 ug via BUCCAL

## 2021-12-05 MED ORDER — PROPOFOL 10 MG/ML IV BOLUS
INTRAVENOUS | Status: AC
  Filled 2021-12-05: qty 20

## 2021-12-05 MED ORDER — SUGAMMADEX SODIUM 200 MG/2ML IV SOLN
INTRAVENOUS | Status: DC | PRN
  Administered 2021-12-05: 145.2 mg via INTRAVENOUS

## 2021-12-05 MED ORDER — ONDANSETRON HCL 4 MG/2ML IJ SOLN
INTRAMUSCULAR | Status: DC | PRN
  Administered 2021-12-05: 4 mg via INTRAVENOUS

## 2021-12-05 MED ORDER — FENTANYL CITRATE (PF) 250 MCG/5ML IJ SOLN
INTRAMUSCULAR | Status: AC
  Filled 2021-12-05: qty 5

## 2021-12-05 MED ORDER — LIDOCAINE HCL (PF) 1 % IJ SOLN
INTRAMUSCULAR | Status: AC
  Filled 2021-12-05: qty 30

## 2021-12-05 MED ORDER — INDOCYANINE GREEN 25 MG IV SOLR
2.5000 mg | Freq: Once | INTRAVENOUS | Status: AC
  Filled 2021-12-05: qty 10

## 2021-12-05 MED ORDER — ACETAMINOPHEN 10 MG/ML IV SOLN
INTRAVENOUS | Status: DC | PRN
  Administered 2021-12-05: 1000 mg via INTRAVENOUS

## 2021-12-05 MED ORDER — 0.9 % SODIUM CHLORIDE (POUR BTL) OPTIME
TOPICAL | Status: DC | PRN
  Administered 2021-12-05: 1000 mL

## 2021-12-05 MED ORDER — ONDANSETRON HCL 4 MG PO TABS: 4.0000 mg | ORAL_TABLET | Freq: Three times a day (TID) | ORAL | 1 refills | Status: AC | PRN

## 2021-12-05 MED ORDER — ROCURONIUM BROMIDE 10 MG/ML (PF) SYRINGE
PREFILLED_SYRINGE | INTRAVENOUS | Status: DC | PRN
  Administered 2021-12-05: 100 mg via INTRAVENOUS

## 2021-12-05 MED ORDER — DEXMEDETOMIDINE HCL IN NACL 80 MCG/20ML IV SOLN
INTRAVENOUS | Status: AC
  Filled 2021-12-05: qty 20

## 2021-12-05 MED ORDER — INDOCYANINE GREEN 25 MG IV SOLR
INTRAVENOUS | Status: AC
  Administered 2021-12-05: 2.5 mg via TOPICAL
  Filled 2021-12-05: qty 10

## 2021-12-05 MED ORDER — LIDOCAINE 2% (20 MG/ML) 5 ML SYRINGE
INTRAMUSCULAR | Status: DC | PRN
  Administered 2021-12-05: 40 mg via INTRAVENOUS

## 2021-12-05 SURGICAL SUPPLY — 47 items
ADH SKN CLS APL DERMABOND .7 (GAUZE/BANDAGES/DRESSINGS) ×1
CANNULA REDUC XI 12-8 STAPL (CANNULA) ×1
CANNULA REDUCER 12-8 DVNC XI (CANNULA) ×2 IMPLANT
CLIP LIGATING HEMO O LOK GREEN (MISCELLANEOUS) ×2 IMPLANT
COVER TIP SHEARS 8 DVNC (MISCELLANEOUS) ×2 IMPLANT
COVER TIP SHEARS 8MM DA VINCI (MISCELLANEOUS) ×1
DERMABOND ADVANCED .7 DNX12 (GAUZE/BANDAGES/DRESSINGS) ×2 IMPLANT
DRAPE ARM DVNC X/XI (DISPOSABLE) ×8 IMPLANT
DRAPE COLUMN DVNC XI (DISPOSABLE) ×2 IMPLANT
DRAPE DA VINCI XI ARM (DISPOSABLE) ×4
DRAPE DA VINCI XI COLUMN (DISPOSABLE) ×1
DRAPE HALF SHEET 40X57 (DRAPES) IMPLANT
DRAPE UTILITY W/TAPE 26X15 (DRAPES) IMPLANT
ELECT CAUTERY BLADE 6.4 (BLADE) ×2 IMPLANT
ELECT REM PT RETURN 9FT ADLT (ELECTROSURGICAL) ×1
ELECTRODE REM PT RTRN 9FT ADLT (ELECTROSURGICAL) ×2 IMPLANT
GLOVE BIO SURGEON STRL SZ 6.5 (GLOVE) ×4 IMPLANT
GLOVE BIOGEL PI IND STRL 6.5 (GLOVE) ×2 IMPLANT
GLOVE BIOGEL PI IND STRL 7.0 (GLOVE) ×8 IMPLANT
GLOVE ECLIPSE 6.5 STRL STRAW (GLOVE) IMPLANT
GLOVE SURG SS PI 7.0 STRL IVOR (GLOVE) IMPLANT
GOWN STRL REUS W/TWL LRG LVL3 (GOWN DISPOSABLE) ×8 IMPLANT
KIT PINK PAD W/HEAD ARE REST (MISCELLANEOUS)
KIT PINK PAD W/HEAD ARM REST (MISCELLANEOUS) ×2 IMPLANT
KIT TURNOVER KIT A (KITS) ×2 IMPLANT
MANIFOLD NEPTUNE II (INSTRUMENTS) ×2 IMPLANT
NDL INSUFFLATION 14GA 120MM (NEEDLE) ×2 IMPLANT
NEEDLE HYPO 22GX1.5 SAFETY (NEEDLE) ×2 IMPLANT
NEEDLE INSUFFLATION 14GA 120MM (NEEDLE) ×1 IMPLANT
NS IRRIG 500ML POUR BTL (IV SOLUTION) ×2 IMPLANT
OBTURATOR OPTICAL STANDARD 8MM (TROCAR) ×1
OBTURATOR OPTICAL STND 8 DVNC (TROCAR) ×1
OBTURATOR OPTICALSTD 8 DVNC (TROCAR) ×2 IMPLANT
PACK LAP CHOLECYSTECTOMY (MISCELLANEOUS) ×2 IMPLANT
PAD POSITIONING PINK XL (MISCELLANEOUS) IMPLANT
SEAL CANN UNIV 5-8 DVNC XI (MISCELLANEOUS) ×6 IMPLANT
SEAL XI 5MM-8MM UNIVERSAL (MISCELLANEOUS) ×3
SET TUBE SMOKE EVAC HIGH FLOW (TUBING) ×2 IMPLANT
SOLUTION ELECTROLUBE (MISCELLANEOUS) ×2 IMPLANT
SPIKE FLUID TRANSFER (MISCELLANEOUS) ×2 IMPLANT
STAPLER CANNULA SEAL DVNC XI (STAPLE) ×2 IMPLANT
STAPLER CANNULA SEAL XI (STAPLE) ×1
SUT MNCRL AB 4-0 PS2 18 (SUTURE) ×4 IMPLANT
SUT VICRYL 0 UR6 27IN ABS (SUTURE) ×2 IMPLANT
SYS BAG RETRIEVAL 10MM (BASKET) ×1
SYSTEM BAG RETRIEVAL 10MM (BASKET) ×2 IMPLANT
WATER STERILE IRR 500ML POUR (IV SOLUTION) ×2 IMPLANT

## 2021-12-05 NOTE — Anesthesia Procedure Notes (Signed)
Procedure Name: Intubation Date/Time: 12/05/2021 7:47 AM  Performed by: Maude Leriche, CRNAPre-anesthesia Checklist: Patient identified, Emergency Drugs available, Suction available and Patient being monitored Patient Re-evaluated:Patient Re-evaluated prior to induction Oxygen Delivery Method: Circle system utilized Preoxygenation: Pre-oxygenation with 100% oxygen Induction Type: IV induction Ventilation: Mask ventilation without difficulty Laryngoscope Size: Miller and 2 Grade View: Grade II Tube type: Oral Tube size: 7.0 mm Number of attempts: 1 Airway Equipment and Method: Stylet and Bite block Placement Confirmation: ETT inserted through vocal cords under direct vision, positive ETCO2 and breath sounds checked- equal and bilateral Secured at: 20 cm Tube secured with: Tape Dental Injury: Teeth and Oropharynx as per pre-operative assessment

## 2021-12-05 NOTE — Discharge Instructions (Signed)
Discharge Robotic Assisted Laparoscopic Surgery Instructions:  Common Complaints: Right shoulder pain is common after laparoscopic surgery. This is secondary to the gas used in the surgery being trapped under the diaphragm.  Walk to help your body absorb the gas. This will improve in a few days. Pain at the port sites are common, especially the larger port sites. This will improve with time.  Some nausea is common and poor appetite. The main goal is to stay hydrated the first few days after surgery.   Diet/ Activity: Diet as tolerated. You may not have an appetite, but it is important to stay hydrated. Drink 64 ounces of water a day. Your appetite will return with time.  Shower per your regular routine daily.  Do not take hot showers. Take warm showers that are less than 10 minutes. Rest and listen to your body, but do not remain in bed all day.  Walk everyday for at least 15-20 minutes. Deep cough and move around every 1-2 hours in the first few days after surgery.  Do not lift > 10 lbs, perform excessive bending, pushing, pulling, squatting for 1-2 weeks after surgery.  Do not pick at the dermabond glue on your incision sites.  This glue film will remain in place for 1-2 weeks and will start to peel off.  Do not place lotions or balms on your incision unless instructed to specifically by Dr. Constance Haw.   Pain Expectations and Narcotics: -After surgery you will have pain associated with your incisions and this is normal. The pain is muscular and nerve pain, and will get better with time. -You are encouraged and expected to take non narcotic medications like tylenol and ibuprofen (when able) to treat pain as multiple modalities can aid with pain treatment. -Narcotics are only used when pain is severe or there is breakthrough pain. -You are not expected to have a pain score of 0 after surgery, as we cannot prevent pain. A pain score of 3-4 that allows you to be functional, move, walk, and tolerate  some activity is the goal. The pain will continue to improve over the days after surgery and is dependent on your surgery. -Due to [place] law, we are only able to give a certain amount of pain medication to treat post operative pain, and we only give additional narcotics on a patient by patient basis.  -For most laparoscopic surgery, studies have shown that the majority of patients only need 10-15 narcotic pills, and for open surgeries most patients only need 15-20.   -Having appropriate expectations of pain and knowledge of pain management with non narcotics is important as we do not want anyone to become addicted to narcotic pain medication.  -Using ice packs in the first 48 hours and heating pads after 48 hours, wearing an abdominal binder (when recommended), and using over the counter medications are all ways to help with pain management.   -Simple acts like meditation and mindfulness practices after surgery can also help with pain control and research has proven the benefit of these practices.  Medication: Take tylenol and ibuprofen as needed for pain control, alternating every 4-6 hours.  Example:  Tylenol '1000mg'$  @ 6am, 12noon, 6pm, 14mdnight (Do not exceed '4000mg'$  of tylenol a day). Ibuprofen '800mg'$  @ 9am, 3pm, 9pm, 3am (Do not exceed '3600mg'$  of ibuprofen a day).  Take Roxicodone for breakthrough pain every 4 hours.  Take Colace for constipation related to narcotic pain medication. If you do not have a bowel movement in 2 days,  take Miralax over the counter.  Drink plenty of water to also prevent constipation.   Contact Information: If you have questions or concerns, please call our office, [phone], Monday- Thursday 8AM-5PM and Friday 8AM-12Noon.  If it is after hours or on the weekend, please call [facility], [phone], [phone], and ask to speak to the surgeon on call for Dr. Constance Haw at [facility].

## 2021-12-05 NOTE — Progress Notes (Signed)
[facility]  Updated husband. Will call in 2 weeks. Rx to [facility].   Curlene Labrum, MD [facility] [address] [place], [place] [address] [phone] (office)

## 2021-12-05 NOTE — Anesthesia Preprocedure Evaluation (Signed)
Anesthesia Evaluation  Patient identified by MRN, date of birth, ID band Patient awake    Reviewed: Allergy & Precautions, H&P , NPO status , Patient's Chart, lab work & pertinent test results, reviewed documented beta blocker date and time   History of Anesthesia Complications Negative for: history of anesthetic complications  Airway Mallampati: II  TM Distance: >3 FB Neck ROM: full    Dental no notable dental hx.    Pulmonary sleep apnea , Current Smoker and Patient abstained from smoking.   Pulmonary exam normal breath sounds clear to auscultation       Cardiovascular Exercise Tolerance: Good hypertension,  Rhythm:regular Rate:Normal     Neuro/Psych  PSYCHIATRIC DISORDERS Anxiety Depression    negative neurological ROS     GI/Hepatic Neg liver ROS,GERD  ,,  Endo/Other  diabetes    Renal/GU negative Renal ROS  negative genitourinary   Musculoskeletal   Abdominal   Peds  Hematology negative hematology ROS (+)   Anesthesia Other Findings   Reproductive/Obstetrics negative OB ROS                              Anesthesia Physical Anesthesia Plan  ASA: 3  Anesthesia Plan: General   Post-op Pain Management:    Induction:   PONV Risk Score and Plan:   Airway Management Planned:   Additional Equipment:   Intra-op Plan:   Post-operative Plan:   Informed Consent: I have reviewed the patients History and Physical, chart, labs and discussed the procedure including the risks, benefits and alternatives for the proposed anesthesia with the patient or authorized representative who has indicated his/her understanding and acceptance.     Dental Advisory Given  Plan Discussed with: CRNA  Anesthesia Plan Comments:          Anesthesia Quick Evaluation

## 2021-12-05 NOTE — Op Note (Addendum)
Preoperative diagnosis:  Adenomyomatosis of the gallbladder   Postoperative diagnosis: same as above  Procedure: Robotic assisted Laparoscopic Cholecystectomy   Anesthesia: GETA   Surgeon: Curlene Labrum, MD   Proctor: Dr. Penni Homans, MD  Specimen: Gallbladder  Complications: None  EBL: <78m  Wound Classification: Clean Contaminated  Indications: see HPI  Findings: Critical view of safety noted Cystic duct and artery identified, ligated and divided, clips remained intact at end of procedure Adequate hemostasis  Description of procedure:  The patient was placed on the operating table in the supine position. SCDs placed, pre-op abx administered.  General anesthesia was induced and OG tube placed by anesthesia. A time-out was completed verifying correct patient, procedure, site, positioning, and implant(s) and/or special equipment prior to beginning this procedure. The abdomen was prepped and draped in the usual sterile fashion.    An incision was made supraumbilical and a towel clip was used to elevate the umbilicus. A Veress needle was inserted and insufflation was started after confirming a positive saline drop test and no immediate increase in abdominal pressure.  After reaching 15 mm, the Veress needle was removed and a 8 mm port was placed via optiview technique under umbilicus measured 262GBfrom gallbladder.  The abdomen was inspected and no abnormalities or injuries were found.  Under direct vision, ports were placed in the following locations: One 8674mport slightly superior and left of the umbilicus, 8cm from the optiviewed port, one 8 mm port placed to the patient right of the umbilical port 8 cm apart.  1 additional 8 mm port placed lateral and slightly superior to the first right sideded port.  Once ports were placed, The table was placed in the reverse Trendelenburg position with the right side up. The Xi platform was brought into the operative field and docked to the ports  successfully.  An endoscope was placed through the umbilical port, prograsp through the adjacent patient right port, fenestrated grasper to the far patient right lateral port, and then a hook cautery in the left port.  The dome of the gallbladder was grasped with prograsp, passed and retracted over the dome of the liver. Adhesions between the gallbladder and omentum, duodenum and transverse colon were lysed via hook cautery. The infundibulum was grasped with the fenestrated grasper and retracted in a dynamic fashion to clear the lateral and medial peritoneum from the gallbladder up the hepatic bed. Then I retracted to the right lower quadrant exposing Calot's triangle. The peritoneum overlying the gallbladder infundibulum was then dissected  and the cystic duct and cystic artery identified.  Throughout firefly was used to locate the cystic duct and verify I was clear of the common and hepatic ducts. Critical view of safety with the liver bed clearly visible behind the duct and artery with no additional structures noted.  The cystic duct and cystic artery clipped and divided close to the gallbladder.     The gallbladder was then dissected from its peritoneal and liver bed attachments by electrocautery. Hemostasis was checked prior to removing the hook cautery and the 74m80mndo Catch bag was then placed through the left port and the gallbladder was removed through the umbilical port using a laparoscopic grasper, a kelly was used to dilate this track.  The gallbladder was passed off the table as a specimen. There was no evidence of bleeding from the gallbladder fossa or cystic artery or leakage of the bile from the cystic duct stump. The umbilical port site was closed with 0 vicryl  under direct vision.  Abdomen desufflated and secondary trocars were removed under direct vision. No bleeding was noted. All skin incisions then closed with subcuticular sutures of 4-0 monocryl and dressed with topical skin adhesive. The  orogastric tube was removed and patient extubated.  The patient tolerated the procedure well and was taken to the postanesthesia care unit in stable condition.  All sponge and instrument count correct at end of procedure.   Curlene Labrum, MD [facility] [address] [place], [place] [address] [phone] (office)

## 2021-12-05 NOTE — Transfer of Care (Signed)
Immediate Anesthesia Transfer of Care Note  Patient: Janice Hamilton  Procedure(s) Performed: XI ROBOTIC ASSISTED LAPAROSCOPIC CHOLECYSTECTOMY (Abdomen)  Patient Location: PACU  Anesthesia Type:General  Level of Consciousness: awake, alert , and oriented  Airway & Oxygen Therapy: Patient Spontanous Breathing and Patient connected to nasal cannula oxygen  Post-op Assessment: Report given to RN, Post -op Vital signs reviewed and stable, Patient moving all extremities X 4, and Patient able to stick tongue midline  Post vital signs: Reviewed  Last Vitals:  Vitals Value Taken Time  BP 185/90 12/05/21 0925  Temp 36.8 C 12/05/21 0925  Pulse 86 12/05/21 0926  Resp 27 12/05/21 0926  SpO2 97 % 12/05/21 0926  Vitals shown include unvalidated device data.  Last Pain:  Vitals:   12/05/21 0659  TempSrc: Oral  PainSc: 0-No pain      Patients Stated Pain Goal: 8 (57/90/38 3338)  Complications: No notable events documented.

## 2021-12-05 NOTE — Anesthesia Postprocedure Evaluation (Signed)
Anesthesia Post Note  Patient: Delanna Henrickson  Procedure(s) Performed: XI ROBOTIC ASSISTED LAPAROSCOPIC CHOLECYSTECTOMY (Abdomen)  Patient location during evaluation: PACU Anesthesia Type: General Level of consciousness: awake and alert Pain management: pain level controlled Vital Signs Assessment: post-procedure vital signs reviewed and stable Respiratory status: spontaneous breathing, nonlabored ventilation, respiratory function stable and patient connected to nasal cannula oxygen Cardiovascular status: blood pressure returned to baseline and stable Postop Assessment: no apparent nausea or vomiting Anesthetic complications: no   No notable events documented.   Last Vitals:  Vitals:   12/05/21 0930 12/05/21 0957  BP: (!) 178/89 (!) 189/101  Pulse: 81 86  Resp: (!) 26 16  Temp:  36.8 C  SpO2: 94% 92%    Last Pain:  Vitals:   12/05/21 0956  TempSrc:   PainSc: 2                  Selby Slovacek Clyde Canterbury

## 2021-12-05 NOTE — Interval H&P Note (Signed)
History and Physical Interval Note:  12/05/2021 7:19 AM  Janice Hamilton  has presented today for surgery, with the diagnosis of Adenomyomatosis of gallbladder.  The various methods of treatment have been discussed with the patient and family. After consideration of risks, benefits and other options for treatment, the patient has consented to  Procedure(s): XI ROBOTIC [place] (N/A) as a surgical intervention.  The patient's history has been reviewed, patient examined, no change in status, stable for surgery.  I have reviewed the patient's chart and labs.  Questions were answered to the patient's satisfaction.     Virl Cagey

## 2021-12-06 LAB — SURGICAL PATHOLOGY

## 2021-12-12 DIAGNOSIS — G4733 Obstructive sleep apnea (adult) (pediatric): Secondary | ICD-10-CM | POA: Diagnosis not present

## 2021-12-20 ENCOUNTER — Ambulatory Visit (INDEPENDENT_AMBULATORY_CARE_PROVIDER_SITE_OTHER): Payer: Medicare Other | Admitting: General Surgery

## 2021-12-20 DIAGNOSIS — D135 Benign neoplasm of extrahepatic bile ducts: Secondary | ICD-10-CM

## 2021-12-20 NOTE — Progress Notes (Signed)
[facility]  I am calling the patient for post operative evaluation. This is not a billable encounter as it is under the [place] charges for the surgery.  The patient had a robotic assisted laparoscopic cholecystectomy on 11/15. The patient reports that she is doing well. The are tolerating a diet, having good pain control, and having regular Bms.  The incisions are healing. The patient has no concerns.   Pathology: A. GALLBLADDER, CHOLECYSTECTOMY:  - Chronic cholecystitis.  - Cholesterolosis.  - Cholelithiasis.   Will see the patient PRN.  Activity and diet as tolerated.   Curlene Labrum, MD [facility] [address] [place], [place] [address] [phone] (office)

## 2022-02-20 DIAGNOSIS — I10 Essential (primary) hypertension: Secondary | ICD-10-CM | POA: Diagnosis not present

## 2022-02-20 DIAGNOSIS — E1165 Type 2 diabetes mellitus with hyperglycemia: Secondary | ICD-10-CM | POA: Diagnosis not present

## 2022-02-26 DIAGNOSIS — K219 Gastro-esophageal reflux disease without esophagitis: Secondary | ICD-10-CM | POA: Diagnosis not present

## 2022-02-26 DIAGNOSIS — E1165 Type 2 diabetes mellitus with hyperglycemia: Secondary | ICD-10-CM | POA: Diagnosis not present

## 2022-02-26 DIAGNOSIS — F17210 Nicotine dependence, cigarettes, uncomplicated: Secondary | ICD-10-CM | POA: Diagnosis not present

## 2022-02-26 DIAGNOSIS — G4733 Obstructive sleep apnea (adult) (pediatric): Secondary | ICD-10-CM | POA: Diagnosis not present

## 2022-02-26 DIAGNOSIS — N1831 Chronic kidney disease, stage 3a: Secondary | ICD-10-CM | POA: Diagnosis not present

## 2022-02-26 DIAGNOSIS — Z0000 Encounter for general adult medical examination without abnormal findings: Secondary | ICD-10-CM | POA: Diagnosis not present

## 2022-02-26 DIAGNOSIS — M1990 Unspecified osteoarthritis, unspecified site: Secondary | ICD-10-CM | POA: Diagnosis not present

## 2022-02-26 DIAGNOSIS — M810 Age-related osteoporosis without current pathological fracture: Secondary | ICD-10-CM | POA: Diagnosis not present

## 2022-02-26 DIAGNOSIS — E1122 Type 2 diabetes mellitus with diabetic chronic kidney disease: Secondary | ICD-10-CM | POA: Diagnosis not present

## 2022-02-26 DIAGNOSIS — E782 Mixed hyperlipidemia: Secondary | ICD-10-CM | POA: Diagnosis not present

## 2022-06-24 ENCOUNTER — Ambulatory Visit: Payer: Medicare Other | Admitting: Adult Health

## 2022-08-26 DIAGNOSIS — E1165 Type 2 diabetes mellitus with hyperglycemia: Secondary | ICD-10-CM | POA: Diagnosis not present

## 2022-08-26 DIAGNOSIS — E782 Mixed hyperlipidemia: Secondary | ICD-10-CM | POA: Diagnosis not present

## 2022-09-02 DIAGNOSIS — G4733 Obstructive sleep apnea (adult) (pediatric): Secondary | ICD-10-CM | POA: Diagnosis not present

## 2022-09-02 DIAGNOSIS — E1122 Type 2 diabetes mellitus with diabetic chronic kidney disease: Secondary | ICD-10-CM | POA: Diagnosis not present

## 2022-09-02 DIAGNOSIS — G4700 Insomnia, unspecified: Secondary | ICD-10-CM | POA: Diagnosis not present

## 2022-09-02 DIAGNOSIS — L299 Pruritus, unspecified: Secondary | ICD-10-CM | POA: Diagnosis not present

## 2022-09-02 DIAGNOSIS — R21 Rash and other nonspecific skin eruption: Secondary | ICD-10-CM | POA: Diagnosis not present

## 2022-09-02 DIAGNOSIS — E782 Mixed hyperlipidemia: Secondary | ICD-10-CM | POA: Diagnosis not present

## 2022-09-02 DIAGNOSIS — Z0000 Encounter for general adult medical examination without abnormal findings: Secondary | ICD-10-CM | POA: Diagnosis not present

## 2022-09-02 DIAGNOSIS — Z0001 Encounter for general adult medical examination with abnormal findings: Secondary | ICD-10-CM | POA: Diagnosis not present

## 2022-09-02 DIAGNOSIS — N1831 Chronic kidney disease, stage 3a: Secondary | ICD-10-CM | POA: Diagnosis not present

## 2022-09-02 DIAGNOSIS — E1165 Type 2 diabetes mellitus with hyperglycemia: Secondary | ICD-10-CM | POA: Diagnosis not present

## 2022-09-04 DIAGNOSIS — R030 Elevated blood-pressure reading, without diagnosis of hypertension: Secondary | ICD-10-CM | POA: Diagnosis not present

## 2022-10-02 ENCOUNTER — Encounter: Payer: Self-pay | Admitting: *Deleted

## 2022-12-27 DIAGNOSIS — E1165 Type 2 diabetes mellitus with hyperglycemia: Secondary | ICD-10-CM | POA: Diagnosis not present

## 2022-12-27 DIAGNOSIS — E782 Mixed hyperlipidemia: Secondary | ICD-10-CM | POA: Diagnosis not present

## 2023-01-02 DIAGNOSIS — E1165 Type 2 diabetes mellitus with hyperglycemia: Secondary | ICD-10-CM | POA: Diagnosis not present

## 2023-01-02 DIAGNOSIS — M810 Age-related osteoporosis without current pathological fracture: Secondary | ICD-10-CM | POA: Diagnosis not present

## 2023-01-02 DIAGNOSIS — N1831 Chronic kidney disease, stage 3a: Secondary | ICD-10-CM | POA: Diagnosis not present

## 2023-01-02 DIAGNOSIS — E1122 Type 2 diabetes mellitus with diabetic chronic kidney disease: Secondary | ICD-10-CM | POA: Diagnosis not present

## 2023-01-02 DIAGNOSIS — F17210 Nicotine dependence, cigarettes, uncomplicated: Secondary | ICD-10-CM | POA: Diagnosis not present

## 2023-01-02 DIAGNOSIS — G4733 Obstructive sleep apnea (adult) (pediatric): Secondary | ICD-10-CM | POA: Diagnosis not present

## 2023-01-02 DIAGNOSIS — M1990 Unspecified osteoarthritis, unspecified site: Secondary | ICD-10-CM | POA: Diagnosis not present

## 2023-01-02 DIAGNOSIS — K219 Gastro-esophageal reflux disease without esophagitis: Secondary | ICD-10-CM | POA: Diagnosis not present

## 2023-01-02 DIAGNOSIS — E782 Mixed hyperlipidemia: Secondary | ICD-10-CM | POA: Diagnosis not present

## 2023-01-06 ENCOUNTER — Other Ambulatory Visit (HOSPITAL_COMMUNITY): Payer: Self-pay | Admitting: Radiology

## 2023-01-06 DIAGNOSIS — R0609 Other forms of dyspnea: Secondary | ICD-10-CM

## 2023-03-06 ENCOUNTER — Ambulatory Visit (HOSPITAL_COMMUNITY): Admission: RE | Admit: 2023-03-06 | Payer: Medicare Other | Source: Ambulatory Visit

## 2023-03-10 IMAGING — MG MM DIGITAL SCREENING BILAT W/ TOMO AND CAD
6 of 12 series · 6 of 36 positions shown · non-contrast
Comparison: None.

CLINICAL DATA: Screening.

EXAM:
DIGITAL SCREENING BILATERAL MAMMOGRAM WITH TOMOSYNTHESIS AND CAD
TECHNIQUE: Bilateral screening digital craniocaudal and mediolateral oblique
mammograms were obtained. Bilateral screening digital breast
tomosynthesis was performed. The images were evaluated with
computer-aided detection.

[L MLO synth-2D]
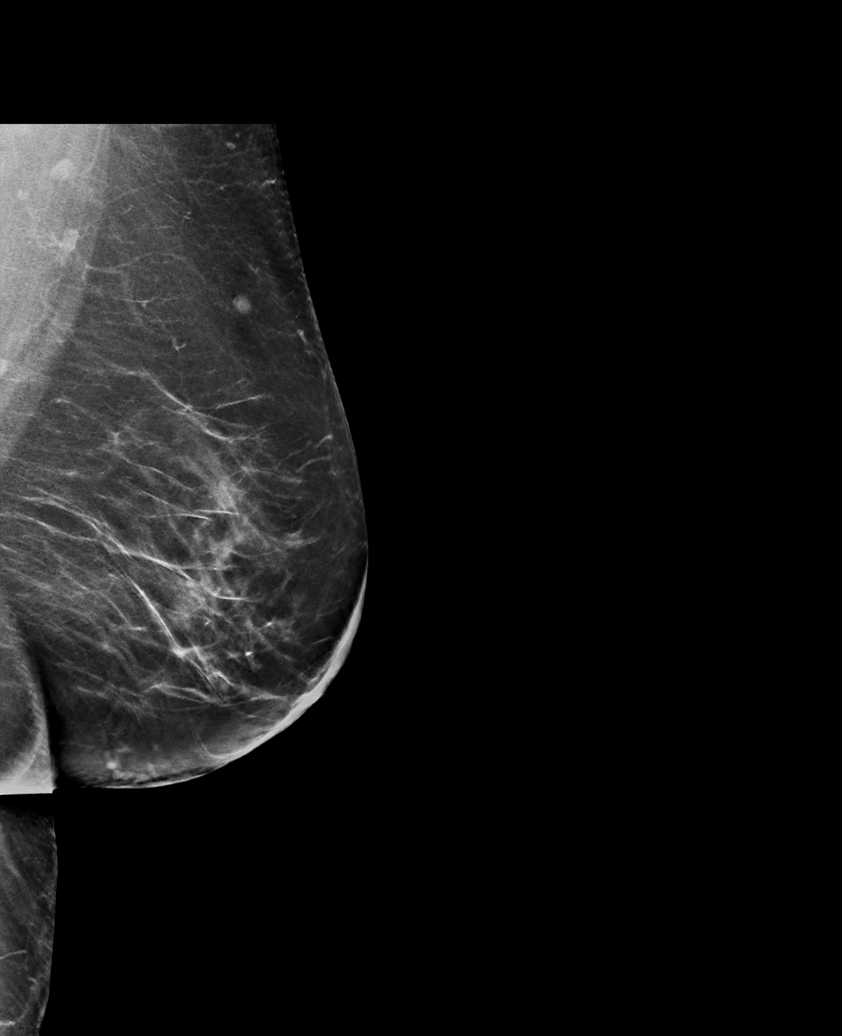

[R CC synth-2D (1 of 2)]
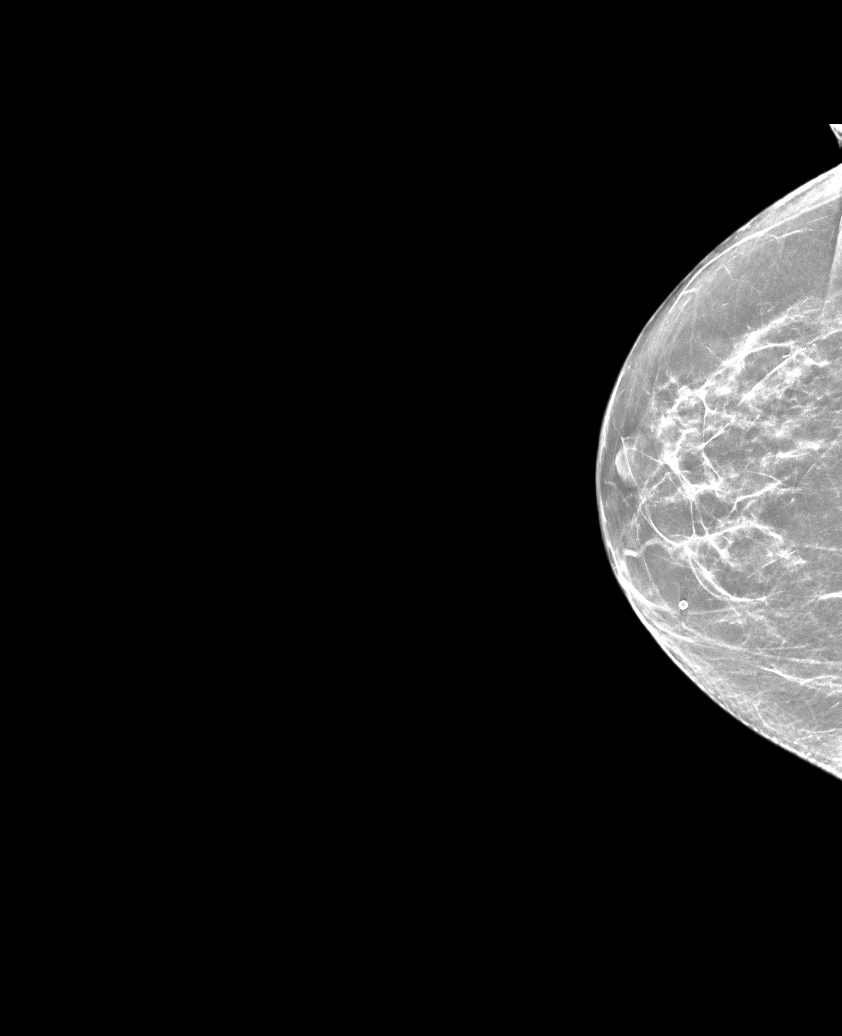

[R CC synth-2D (2 of 2)]
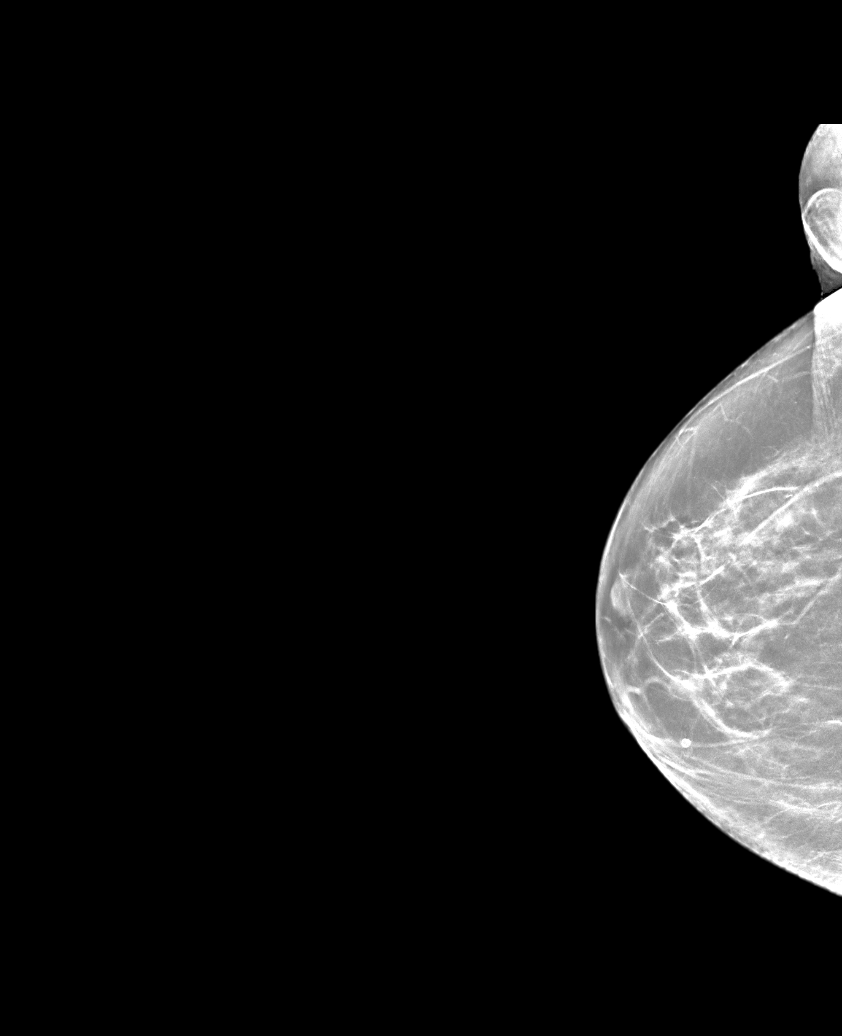

[L CC synth-2D]
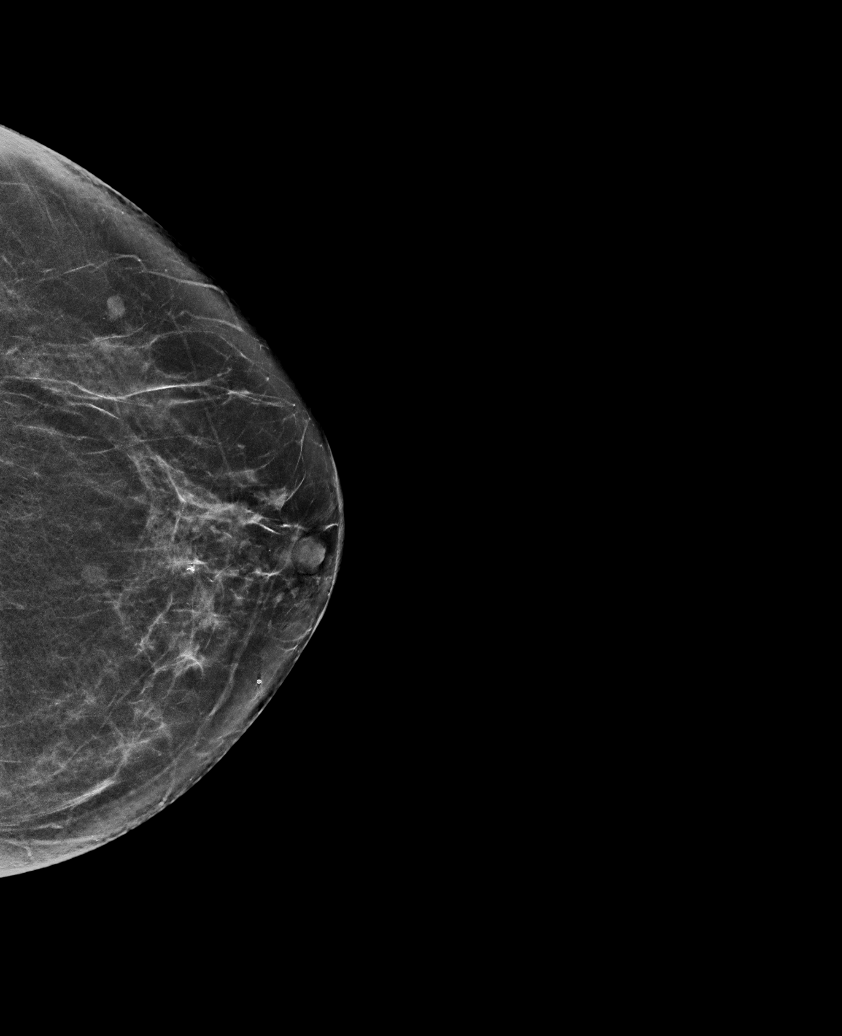

[R MLO synth-2D (1 of 2)]
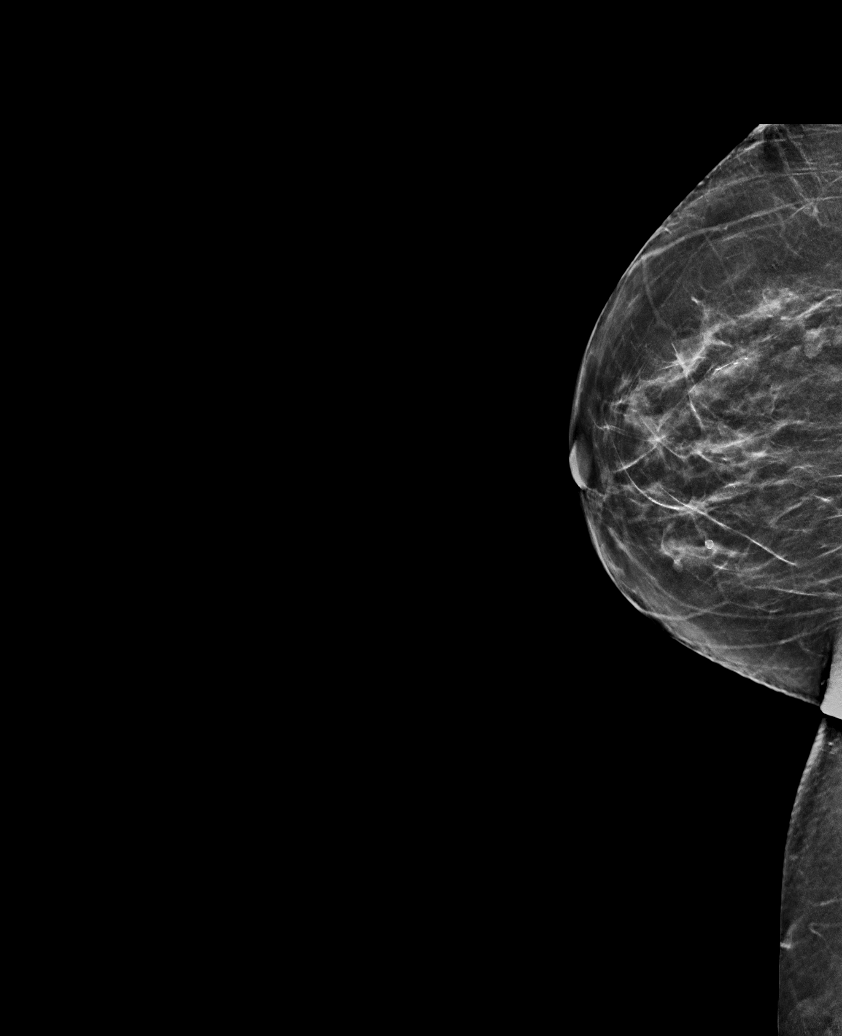

[R MLO synth-2D (2 of 2)]
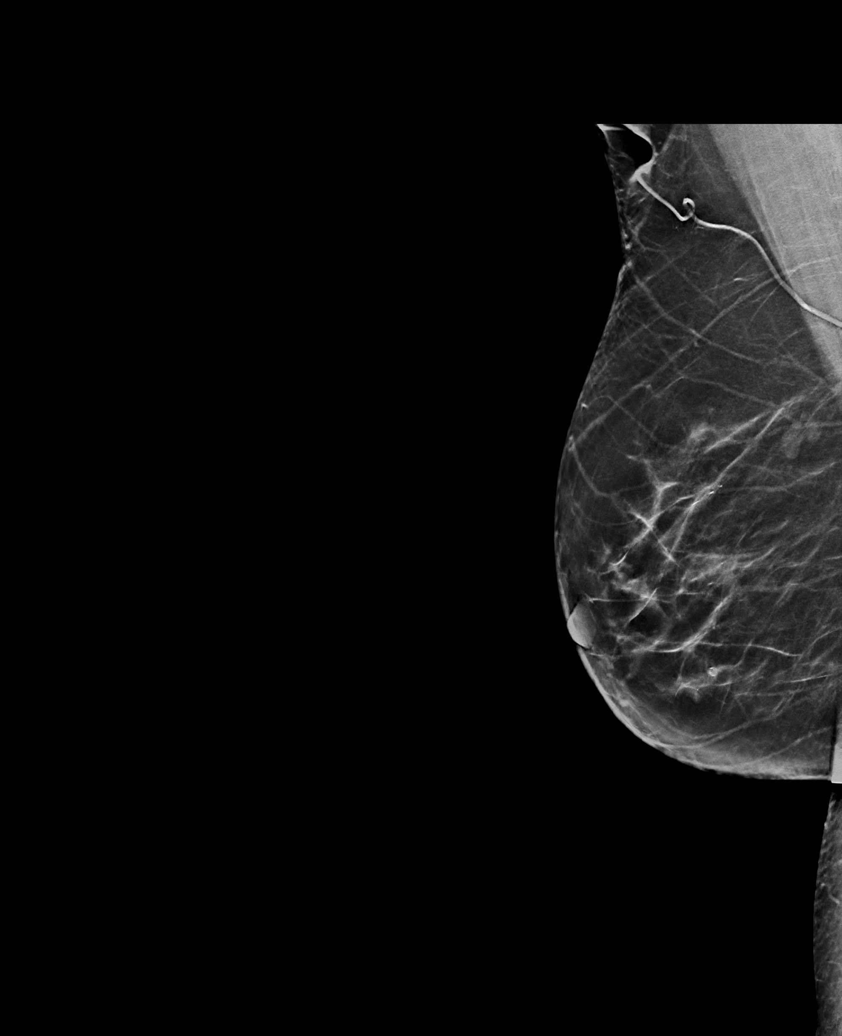

[6 of 36 positions shown; findings below may reference images not displayed]

ACR Breast Density Category c: The breast tissue is heterogeneously
dense, which may obscure small masses.
FINDINGS: In the right breast mass requires further evaluation.

In the left breast mass and asymmetry require further evaluation.
IMPRESSION: Further evaluation is suggested for possible mass in the right
breast.

Further evaluation is suggested for possible mass and asymmetry in
the left breast.

RECOMMENDATION:
Diagnostic mammogram and possibly ultrasound of both breasts.
(Code:[id number])

The patient will be contacted regarding the findings, and additional
imaging will be scheduled.

BI-RADS CATEGORY  0: Incomplete. Need additional imaging evaluation
and/or prior mammograms for comparison.

## 2023-04-04 DIAGNOSIS — I10 Essential (primary) hypertension: Secondary | ICD-10-CM | POA: Diagnosis not present

## 2023-04-04 DIAGNOSIS — L0390 Cellulitis, unspecified: Secondary | ICD-10-CM | POA: Diagnosis not present

## 2023-04-08 DIAGNOSIS — E1165 Type 2 diabetes mellitus with hyperglycemia: Secondary | ICD-10-CM | POA: Diagnosis not present

## 2023-04-08 DIAGNOSIS — E782 Mixed hyperlipidemia: Secondary | ICD-10-CM | POA: Diagnosis not present

## 2023-04-14 ENCOUNTER — Other Ambulatory Visit (HOSPITAL_COMMUNITY): Payer: Self-pay | Admitting: Nurse Practitioner

## 2023-04-14 ENCOUNTER — Other Ambulatory Visit (HOSPITAL_COMMUNITY): Payer: Self-pay | Admitting: Radiology

## 2023-04-14 ENCOUNTER — Encounter (HOSPITAL_COMMUNITY): Payer: Self-pay | Admitting: Nurse Practitioner

## 2023-04-14 DIAGNOSIS — G4733 Obstructive sleep apnea (adult) (pediatric): Secondary | ICD-10-CM | POA: Diagnosis not present

## 2023-04-14 DIAGNOSIS — R110 Nausea: Secondary | ICD-10-CM | POA: Diagnosis not present

## 2023-04-14 DIAGNOSIS — M810 Age-related osteoporosis without current pathological fracture: Secondary | ICD-10-CM | POA: Diagnosis not present

## 2023-04-14 DIAGNOSIS — R197 Diarrhea, unspecified: Secondary | ICD-10-CM | POA: Diagnosis not present

## 2023-04-14 DIAGNOSIS — N1831 Chronic kidney disease, stage 3a: Secondary | ICD-10-CM | POA: Diagnosis not present

## 2023-04-14 DIAGNOSIS — F17210 Nicotine dependence, cigarettes, uncomplicated: Secondary | ICD-10-CM | POA: Diagnosis not present

## 2023-04-14 DIAGNOSIS — E1122 Type 2 diabetes mellitus with diabetic chronic kidney disease: Secondary | ICD-10-CM | POA: Diagnosis not present

## 2023-04-14 DIAGNOSIS — E782 Mixed hyperlipidemia: Secondary | ICD-10-CM | POA: Diagnosis not present

## 2023-04-14 DIAGNOSIS — T148XXD Other injury of unspecified body region, subsequent encounter: Secondary | ICD-10-CM | POA: Diagnosis not present

## 2023-04-14 DIAGNOSIS — K219 Gastro-esophageal reflux disease without esophagitis: Secondary | ICD-10-CM | POA: Diagnosis not present

## 2023-04-16 ENCOUNTER — Other Ambulatory Visit (HOSPITAL_COMMUNITY): Payer: Self-pay | Admitting: Nurse Practitioner

## 2023-04-16 ENCOUNTER — Telehealth: Payer: Self-pay

## 2023-04-16 DIAGNOSIS — N63 Unspecified lump in unspecified breast: Secondary | ICD-10-CM

## 2023-04-16 NOTE — Telephone Encounter (Signed)
 Received referral from Micael Hampshire FNP-C for OSA. Placed in sleep mailbox

## 2023-05-20 ENCOUNTER — Ambulatory Visit (HOSPITAL_COMMUNITY)

## 2023-05-20 ENCOUNTER — Encounter (HOSPITAL_COMMUNITY)

## 2023-05-20 ENCOUNTER — Encounter (HOSPITAL_COMMUNITY): Payer: Self-pay

## 2023-07-16 DIAGNOSIS — E1165 Type 2 diabetes mellitus with hyperglycemia: Secondary | ICD-10-CM | POA: Diagnosis not present

## 2023-07-16 DIAGNOSIS — E782 Mixed hyperlipidemia: Secondary | ICD-10-CM | POA: Diagnosis not present

## 2023-07-30 DIAGNOSIS — N1831 Chronic kidney disease, stage 3a: Secondary | ICD-10-CM | POA: Diagnosis not present

## 2023-07-30 DIAGNOSIS — F17210 Nicotine dependence, cigarettes, uncomplicated: Secondary | ICD-10-CM | POA: Diagnosis not present

## 2023-07-30 DIAGNOSIS — E1122 Type 2 diabetes mellitus with diabetic chronic kidney disease: Secondary | ICD-10-CM | POA: Diagnosis not present

## 2023-07-30 DIAGNOSIS — R197 Diarrhea, unspecified: Secondary | ICD-10-CM | POA: Diagnosis not present

## 2023-07-30 DIAGNOSIS — N189 Chronic kidney disease, unspecified: Secondary | ICD-10-CM | POA: Diagnosis not present

## 2023-07-30 DIAGNOSIS — F17200 Nicotine dependence, unspecified, uncomplicated: Secondary | ICD-10-CM | POA: Diagnosis not present

## 2023-07-30 DIAGNOSIS — G4733 Obstructive sleep apnea (adult) (pediatric): Secondary | ICD-10-CM | POA: Diagnosis not present

## 2023-07-30 DIAGNOSIS — E782 Mixed hyperlipidemia: Secondary | ICD-10-CM | POA: Diagnosis not present

## 2023-07-30 DIAGNOSIS — M810 Age-related osteoporosis without current pathological fracture: Secondary | ICD-10-CM | POA: Diagnosis not present

## 2023-07-30 DIAGNOSIS — E1165 Type 2 diabetes mellitus with hyperglycemia: Secondary | ICD-10-CM | POA: Diagnosis not present
